# Patient Record
Sex: Male | Born: 1955 | Race: Black or African American | Hispanic: No | Smoking: Current every day smoker
Health system: Southern US, Community
[De-identification: ages and names within clinical notes are randomized; demographics above are authoritative.]

## PROBLEM LIST (undated history)

## (undated) DIAGNOSIS — Z9989 Dependence on other enabling machines and devices: Secondary | ICD-10-CM

## (undated) DIAGNOSIS — D75A Glucose-6-phosphate dehydrogenase (G6PD) deficiency without anemia: Secondary | ICD-10-CM

## (undated) DIAGNOSIS — G4730 Sleep apnea, unspecified: Secondary | ICD-10-CM

## (undated) DIAGNOSIS — G4733 Obstructive sleep apnea (adult) (pediatric): Secondary | ICD-10-CM

## (undated) DIAGNOSIS — R0600 Dyspnea, unspecified: Secondary | ICD-10-CM

## (undated) DIAGNOSIS — I10 Essential (primary) hypertension: Secondary | ICD-10-CM

## (undated) DIAGNOSIS — E119 Type 2 diabetes mellitus without complications: Secondary | ICD-10-CM

## (undated) DIAGNOSIS — M1990 Unspecified osteoarthritis, unspecified site: Secondary | ICD-10-CM

## (undated) DIAGNOSIS — M109 Gout, unspecified: Secondary | ICD-10-CM

## (undated) HISTORY — PX: KNEE SURGERY: SHX244

---

## 1978-10-12 HISTORY — PX: LAPARASCOPIC MEDICAN ARCUATE LIGAMENT RELEASE: SHX6852

## 2014-10-26 ENCOUNTER — Inpatient Hospital Stay (HOSPITAL_COMMUNITY)
Admission: EM | Admit: 2014-10-26 | Discharge: 2014-10-29 | DRG: 291 | Disposition: A | Discharge status: Home / Self Care | Payer: Non-veteran care | Attending: Internal Medicine | Admitting: Internal Medicine

## 2014-10-26 DIAGNOSIS — R778 Other specified abnormalities of plasma proteins: Secondary | ICD-10-CM | POA: Diagnosis not present

## 2014-10-26 DIAGNOSIS — D649 Anemia, unspecified: Secondary | ICD-10-CM | POA: Diagnosis present

## 2014-10-26 DIAGNOSIS — Z7982 Long term (current) use of aspirin: Secondary | ICD-10-CM

## 2014-10-26 DIAGNOSIS — J810 Acute pulmonary edema: Secondary | ICD-10-CM

## 2014-10-26 DIAGNOSIS — I5032 Chronic diastolic (congestive) heart failure: Secondary | ICD-10-CM | POA: Diagnosis present

## 2014-10-26 DIAGNOSIS — T502X5A Adverse effect of carbonic-anhydrase inhibitors, benzothiadiazides and other diuretics, initial encounter: Secondary | ICD-10-CM | POA: Diagnosis present

## 2014-10-26 DIAGNOSIS — G4733 Obstructive sleep apnea (adult) (pediatric): Secondary | ICD-10-CM | POA: Diagnosis present

## 2014-10-26 DIAGNOSIS — F17210 Nicotine dependence, cigarettes, uncomplicated: Secondary | ICD-10-CM | POA: Diagnosis present

## 2014-10-26 DIAGNOSIS — D75A Glucose-6-phosphate dehydrogenase (G6PD) deficiency without anemia: Secondary | ICD-10-CM | POA: Diagnosis present

## 2014-10-26 DIAGNOSIS — I119 Hypertensive heart disease without heart failure: Secondary | ICD-10-CM | POA: Diagnosis present

## 2014-10-26 DIAGNOSIS — Z79899 Other long term (current) drug therapy: Secondary | ICD-10-CM

## 2014-10-26 DIAGNOSIS — F17200 Nicotine dependence, unspecified, uncomplicated: Secondary | ICD-10-CM | POA: Diagnosis present

## 2014-10-26 DIAGNOSIS — G4730 Sleep apnea, unspecified: Secondary | ICD-10-CM

## 2014-10-26 DIAGNOSIS — E11319 Type 2 diabetes mellitus with unspecified diabetic retinopathy without macular edema: Secondary | ICD-10-CM | POA: Diagnosis present

## 2014-10-26 DIAGNOSIS — E1122 Type 2 diabetes mellitus with diabetic chronic kidney disease: Secondary | ICD-10-CM | POA: Diagnosis present

## 2014-10-26 DIAGNOSIS — J9601 Acute respiratory failure with hypoxia: Secondary | ICD-10-CM | POA: Diagnosis present

## 2014-10-26 DIAGNOSIS — E1165 Type 2 diabetes mellitus with hyperglycemia: Secondary | ICD-10-CM | POA: Diagnosis present

## 2014-10-26 DIAGNOSIS — T380X5A Adverse effect of glucocorticoids and synthetic analogues, initial encounter: Secondary | ICD-10-CM | POA: Diagnosis present

## 2014-10-26 DIAGNOSIS — N183 Chronic kidney disease, stage 3 (moderate): Secondary | ICD-10-CM | POA: Diagnosis present

## 2014-10-26 DIAGNOSIS — I130 Hypertensive heart and chronic kidney disease with heart failure and stage 1 through stage 4 chronic kidney disease, or unspecified chronic kidney disease: Secondary | ICD-10-CM | POA: Diagnosis present

## 2014-10-26 DIAGNOSIS — Z8701 Personal history of pneumonia (recurrent): Secondary | ICD-10-CM

## 2014-10-26 DIAGNOSIS — Z6841 Body Mass Index (BMI) 40.0 and over, adult: Secondary | ICD-10-CM

## 2014-10-26 DIAGNOSIS — E113299 Type 2 diabetes mellitus with mild nonproliferative diabetic retinopathy without macular edema, unspecified eye: Secondary | ICD-10-CM

## 2014-10-26 DIAGNOSIS — J811 Chronic pulmonary edema: Secondary | ICD-10-CM | POA: Diagnosis present

## 2014-10-26 DIAGNOSIS — R7989 Other specified abnormal findings of blood chemistry: Secondary | ICD-10-CM | POA: Diagnosis not present

## 2014-10-26 DIAGNOSIS — I5033 Acute on chronic diastolic (congestive) heart failure: Principal | ICD-10-CM | POA: Diagnosis present

## 2014-10-26 DIAGNOSIS — R0902 Hypoxemia: Secondary | ICD-10-CM

## 2014-10-26 HISTORY — DX: Dependence on other enabling machines and devices: Z99.89

## 2014-10-26 HISTORY — DX: Glucose-6-phosphate dehydrogenase (G6PD) deficiency without anemia: D75.A

## 2014-10-26 HISTORY — DX: Obstructive sleep apnea (adult) (pediatric): G47.33

## 2014-10-26 HISTORY — DX: Morbid (severe) obesity due to excess calories: E66.01

## 2014-10-26 HISTORY — DX: Essential (primary) hypertension: I10

## 2014-10-26 HISTORY — DX: Sleep apnea, unspecified: G47.30

## 2014-10-27 ENCOUNTER — Encounter (HOSPITAL_COMMUNITY): Payer: Self-pay | Admitting: *Deleted

## 2014-10-27 ENCOUNTER — Emergency Department (HOSPITAL_COMMUNITY): Payer: Non-veteran care

## 2014-10-27 DIAGNOSIS — E113299 Type 2 diabetes mellitus with mild nonproliferative diabetic retinopathy without macular edema, unspecified eye: Secondary | ICD-10-CM

## 2014-10-27 DIAGNOSIS — J811 Chronic pulmonary edema: Secondary | ICD-10-CM | POA: Diagnosis present

## 2014-10-27 DIAGNOSIS — E119 Type 2 diabetes mellitus without complications: Secondary | ICD-10-CM

## 2014-10-27 DIAGNOSIS — I10 Essential (primary) hypertension: Secondary | ICD-10-CM

## 2014-10-27 DIAGNOSIS — R7989 Other specified abnormal findings of blood chemistry: Secondary | ICD-10-CM | POA: Diagnosis not present

## 2014-10-27 DIAGNOSIS — F17200 Nicotine dependence, unspecified, uncomplicated: Secondary | ICD-10-CM | POA: Diagnosis present

## 2014-10-27 DIAGNOSIS — E669 Obesity, unspecified: Secondary | ICD-10-CM

## 2014-10-27 DIAGNOSIS — I5032 Chronic diastolic (congestive) heart failure: Secondary | ICD-10-CM | POA: Diagnosis not present

## 2014-10-27 DIAGNOSIS — I130 Hypertensive heart and chronic kidney disease with heart failure and stage 1 through stage 4 chronic kidney disease, or unspecified chronic kidney disease: Secondary | ICD-10-CM | POA: Diagnosis present

## 2014-10-27 DIAGNOSIS — F17210 Nicotine dependence, cigarettes, uncomplicated: Secondary | ICD-10-CM | POA: Diagnosis present

## 2014-10-27 DIAGNOSIS — D550 Anemia due to glucose-6-phosphate dehydrogenase [G6PD] deficiency: Secondary | ICD-10-CM

## 2014-10-27 DIAGNOSIS — E1122 Type 2 diabetes mellitus with diabetic chronic kidney disease: Secondary | ICD-10-CM | POA: Diagnosis present

## 2014-10-27 DIAGNOSIS — T502X5A Adverse effect of carbonic-anhydrase inhibitors, benzothiadiazides and other diuretics, initial encounter: Secondary | ICD-10-CM | POA: Diagnosis present

## 2014-10-27 DIAGNOSIS — J9601 Acute respiratory failure with hypoxia: Secondary | ICD-10-CM

## 2014-10-27 DIAGNOSIS — Z79899 Other long term (current) drug therapy: Secondary | ICD-10-CM | POA: Diagnosis not present

## 2014-10-27 DIAGNOSIS — J810 Acute pulmonary edema: Secondary | ICD-10-CM

## 2014-10-27 DIAGNOSIS — G4733 Obstructive sleep apnea (adult) (pediatric): Secondary | ICD-10-CM | POA: Diagnosis present

## 2014-10-27 DIAGNOSIS — I5033 Acute on chronic diastolic (congestive) heart failure: Secondary | ICD-10-CM | POA: Diagnosis not present

## 2014-10-27 DIAGNOSIS — E11319 Type 2 diabetes mellitus with unspecified diabetic retinopathy without macular edema: Secondary | ICD-10-CM | POA: Diagnosis present

## 2014-10-27 DIAGNOSIS — I5031 Acute diastolic (congestive) heart failure: Secondary | ICD-10-CM | POA: Diagnosis not present

## 2014-10-27 DIAGNOSIS — G4730 Sleep apnea, unspecified: Secondary | ICD-10-CM

## 2014-10-27 DIAGNOSIS — I119 Hypertensive heart disease without heart failure: Secondary | ICD-10-CM | POA: Diagnosis present

## 2014-10-27 DIAGNOSIS — T380X5A Adverse effect of glucocorticoids and synthetic analogues, initial encounter: Secondary | ICD-10-CM | POA: Diagnosis present

## 2014-10-27 DIAGNOSIS — R0602 Shortness of breath: Secondary | ICD-10-CM | POA: Diagnosis not present

## 2014-10-27 DIAGNOSIS — R778 Other specified abnormalities of plasma proteins: Secondary | ICD-10-CM | POA: Diagnosis not present

## 2014-10-27 DIAGNOSIS — Z6841 Body Mass Index (BMI) 40.0 and over, adult: Secondary | ICD-10-CM | POA: Diagnosis not present

## 2014-10-27 DIAGNOSIS — Z7982 Long term (current) use of aspirin: Secondary | ICD-10-CM | POA: Diagnosis not present

## 2014-10-27 DIAGNOSIS — Z8701 Personal history of pneumonia (recurrent): Secondary | ICD-10-CM | POA: Diagnosis not present

## 2014-10-27 DIAGNOSIS — I110 Hypertensive heart disease with heart failure: Secondary | ICD-10-CM | POA: Diagnosis not present

## 2014-10-27 DIAGNOSIS — D649 Anemia, unspecified: Secondary | ICD-10-CM | POA: Diagnosis present

## 2014-10-27 DIAGNOSIS — N183 Chronic kidney disease, stage 3 (moderate): Secondary | ICD-10-CM | POA: Diagnosis present

## 2014-10-27 DIAGNOSIS — Z720 Tobacco use: Secondary | ICD-10-CM

## 2014-10-27 DIAGNOSIS — D75A Glucose-6-phosphate dehydrogenase (G6PD) deficiency without anemia: Secondary | ICD-10-CM | POA: Diagnosis present

## 2014-10-27 DIAGNOSIS — E1165 Type 2 diabetes mellitus with hyperglycemia: Secondary | ICD-10-CM | POA: Diagnosis present

## 2014-10-27 LAB — CBC WITH DIFFERENTIAL/PLATELET
Basophils Absolute: 0 10*3/uL (ref 0.0–0.1)
Basophils Relative: 0 % (ref 0–1)
EOS PCT: 0 % (ref 0–5)
Eosinophils Absolute: 0 10*3/uL (ref 0.0–0.7)
HCT: 32.5 % — ABNORMAL LOW (ref 39.0–52.0)
Hemoglobin: 9.8 g/dL — ABNORMAL LOW (ref 13.0–17.0)
Lymphocytes Relative: 4 % — ABNORMAL LOW (ref 12–46)
Lymphs Abs: 0.5 10*3/uL — ABNORMAL LOW (ref 0.7–4.0)
MCH: 26.3 pg (ref 26.0–34.0)
MCHC: 30.2 g/dL (ref 30.0–36.0)
MCV: 87.1 fL (ref 78.0–100.0)
MONOS PCT: 2 % — AB (ref 3–12)
Monocytes Absolute: 0.3 10*3/uL (ref 0.1–1.0)
NEUTROS ABS: 12.7 10*3/uL — AB (ref 1.7–7.7)
NEUTROS PCT: 94 % — AB (ref 43–77)
PLATELETS: 240 10*3/uL (ref 150–400)
RBC: 3.73 MIL/uL — ABNORMAL LOW (ref 4.22–5.81)
RDW: 15.8 % — ABNORMAL HIGH (ref 11.5–15.5)
WBC: 13.5 10*3/uL — ABNORMAL HIGH (ref 4.0–10.5)

## 2014-10-27 LAB — CBC
HCT: 31 % — ABNORMAL LOW (ref 39.0–52.0)
Hemoglobin: 9.5 g/dL — ABNORMAL LOW (ref 13.0–17.0)
MCH: 26.9 pg (ref 26.0–34.0)
MCHC: 30.6 g/dL (ref 30.0–36.0)
MCV: 87.8 fL (ref 78.0–100.0)
Platelets: 255 10*3/uL (ref 150–400)
RBC: 3.53 MIL/uL — ABNORMAL LOW (ref 4.22–5.81)
RDW: 16 % — AB (ref 11.5–15.5)
WBC: 15.1 10*3/uL — ABNORMAL HIGH (ref 4.0–10.5)

## 2014-10-27 LAB — EXPECTORATED SPUTUM ASSESSMENT W REFEX TO RESP CULTURE

## 2014-10-27 LAB — BASIC METABOLIC PANEL
Anion gap: 8 (ref 5–15)
BUN: 15 mg/dL (ref 6–23)
CO2: 24 mmol/L (ref 19–32)
Calcium: 8.7 mg/dL (ref 8.4–10.5)
Chloride: 108 mEq/L (ref 96–112)
Creatinine, Ser: 1.24 mg/dL (ref 0.50–1.35)
GFR calc non Af Amer: 61 mL/min — ABNORMAL LOW (ref >90)
GFR, EST AFRICAN AMERICAN: 71 mL/min — AB (ref >90)
GLUCOSE: 208 mg/dL — AB (ref 70–99)
POTASSIUM: 3.6 mmol/L (ref 3.5–5.1)
SODIUM: 140 mmol/L (ref 135–145)

## 2014-10-27 LAB — PROTIME-INR
INR: 1.05 (ref 0.00–1.49)
Prothrombin Time: 13.8 seconds (ref 11.6–15.2)

## 2014-10-27 LAB — COMPREHENSIVE METABOLIC PANEL
ALT: 21 U/L (ref 0–53)
AST: 29 U/L (ref 0–37)
Albumin: 3.9 g/dL (ref 3.5–5.2)
Alkaline Phosphatase: 95 U/L (ref 39–117)
Anion gap: 9 (ref 5–15)
BILIRUBIN TOTAL: 0.9 mg/dL (ref 0.3–1.2)
BUN: 15 mg/dL (ref 6–23)
CO2: 25 mmol/L (ref 19–32)
Calcium: 8.9 mg/dL (ref 8.4–10.5)
Chloride: 104 mEq/L (ref 96–112)
Creatinine, Ser: 1.23 mg/dL (ref 0.50–1.35)
GFR calc Af Amer: 72 mL/min — ABNORMAL LOW (ref >90)
GFR calc non Af Amer: 62 mL/min — ABNORMAL LOW (ref >90)
Glucose, Bld: 243 mg/dL — ABNORMAL HIGH (ref 70–99)
Potassium: 3.2 mmol/L — ABNORMAL LOW (ref 3.5–5.1)
SODIUM: 138 mmol/L (ref 135–145)
TOTAL PROTEIN: 8.3 g/dL (ref 6.0–8.3)

## 2014-10-27 LAB — EXPECTORATED SPUTUM ASSESSMENT W GRAM STAIN, RFLX TO RESP C

## 2014-10-27 LAB — GLUCOSE, CAPILLARY
GLUCOSE-CAPILLARY: 206 mg/dL — AB (ref 70–99)
GLUCOSE-CAPILLARY: 311 mg/dL — AB (ref 70–99)
GLUCOSE-CAPILLARY: 405 mg/dL — AB (ref 70–99)
Glucose-Capillary: 293 mg/dL — ABNORMAL HIGH (ref 70–99)

## 2014-10-27 LAB — APTT: aPTT: 38 seconds — ABNORMAL HIGH (ref 24–37)

## 2014-10-27 LAB — INFLUENZA PANEL BY PCR (TYPE A & B)
H1N1FLUPCR: NOT DETECTED
INFLAPCR: NEGATIVE
INFLBPCR: NEGATIVE

## 2014-10-27 LAB — TROPONIN I
Troponin I: 0.13 ng/mL — ABNORMAL HIGH (ref <0.031)
Troponin I: 0.11 ng/mL — ABNORMAL HIGH (ref <0.031)
Troponin I: 0.08 ng/mL — ABNORMAL HIGH (ref <0.031)

## 2014-10-27 LAB — HEMOGLOBIN A1C
Hgb A1c MFr Bld: 6.6 % — ABNORMAL HIGH (ref <5.7)
Mean Plasma Glucose: 143 mg/dL — ABNORMAL HIGH (ref <117)

## 2014-10-27 LAB — TSH: TSH: 0.557 u[IU]/mL (ref 0.350–4.500)

## 2014-10-27 LAB — I-STAT TROPONIN, ED: Troponin i, poc: 0.01 ng/mL (ref 0.00–0.08)

## 2014-10-27 LAB — HEPARIN LEVEL (UNFRACTIONATED): Heparin Unfractionated: 0.14 IU/mL — ABNORMAL LOW (ref 0.30–0.70)

## 2014-10-27 LAB — STREP PNEUMONIAE URINARY ANTIGEN: STREP PNEUMO URINARY ANTIGEN: NEGATIVE

## 2014-10-27 LAB — BRAIN NATRIURETIC PEPTIDE: B Natriuretic Peptide: 336.1 pg/mL — ABNORMAL HIGH (ref 0.0–100.0)

## 2014-10-27 LAB — MAGNESIUM: Magnesium: 1.9 mg/dL (ref 1.5–2.5)

## 2014-10-27 MED ORDER — ENOXAPARIN SODIUM 80 MG/0.8ML ~~LOC~~ SOLN
80.0000 mg | Freq: Every day | SUBCUTANEOUS | Status: DC
  Filled 2014-10-27: qty 0.8

## 2014-10-27 MED ORDER — METHYLPREDNISOLONE SODIUM SUCC 125 MG IJ SOLR
60.0000 mg | Freq: Two times a day (BID) | INTRAMUSCULAR | Status: DC
  Administered 2014-10-27 – 2014-10-28 (×3): 60 mg via INTRAVENOUS
  Filled 2014-10-27 (×5): qty 0.96

## 2014-10-27 MED ORDER — ACETAMINOPHEN 325 MG PO TABS: 650.0000 mg | ORAL_TABLET | Freq: Four times a day (QID) | ORAL | Status: DC | PRN

## 2014-10-27 MED ORDER — HEPARIN (PORCINE) IN NACL 100-0.45 UNIT/ML-% IJ SOLN
1800.0000 [IU]/h | INTRAMUSCULAR | Status: DC
  Administered 2014-10-27: 1400 [IU]/h via INTRAVENOUS
  Administered 2014-10-28: 1800 [IU]/h via INTRAVENOUS
  Filled 2014-10-27 (×2): qty 250

## 2014-10-27 MED ORDER — SERTRALINE HCL 100 MG PO TABS
100.0000 mg | ORAL_TABLET | Freq: Every day | ORAL | Status: DC
  Administered 2014-10-27 – 2014-10-29 (×3): 100 mg via ORAL
  Filled 2014-10-27 (×3): qty 1

## 2014-10-27 MED ORDER — POTASSIUM CHLORIDE ER 10 MEQ PO TBCR
20.0000 meq | EXTENDED_RELEASE_TABLET | Freq: Every day | ORAL | Status: DC
  Administered 2014-10-27 – 2014-10-29 (×3): 20 meq via ORAL
  Filled 2014-10-27 (×3): qty 2

## 2014-10-27 MED ORDER — NICOTINE 14 MG/24HR TD PT24
14.0000 mg | MEDICATED_PATCH | Freq: Every day | TRANSDERMAL | Status: DC
  Administered 2014-10-27 – 2014-10-29 (×3): 14 mg via TRANSDERMAL
  Filled 2014-10-27 (×3): qty 1

## 2014-10-27 MED ORDER — IPRATROPIUM-ALBUTEROL 0.5-2.5 (3) MG/3ML IN SOLN
3.0000 mL | Freq: Four times a day (QID) | RESPIRATORY_TRACT | Status: DC
  Administered 2014-10-27 – 2014-10-29 (×6): 3 mL via RESPIRATORY_TRACT
  Filled 2014-10-27 (×7): qty 3

## 2014-10-27 MED ORDER — LISINOPRIL 40 MG PO TABS
40.0000 mg | ORAL_TABLET | Freq: Every day | ORAL | Status: DC
  Administered 2014-10-27: 40 mg via ORAL
  Filled 2014-10-27 (×2): qty 1

## 2014-10-27 MED ORDER — GUAIFENESIN ER 600 MG PO TB12
600.0000 mg | ORAL_TABLET | Freq: Two times a day (BID) | ORAL | Status: DC
  Administered 2014-10-27 – 2014-10-29 (×5): 600 mg via ORAL
  Filled 2014-10-27 (×7): qty 1

## 2014-10-27 MED ORDER — POTASSIUM CHLORIDE CRYS ER 20 MEQ PO TBCR
20.0000 meq | EXTENDED_RELEASE_TABLET | Freq: Once | ORAL | Status: AC
  Administered 2014-10-27: 20 meq via ORAL
  Filled 2014-10-27: qty 1

## 2014-10-27 MED ORDER — DEXTROSE 5 % IV SOLN
500.0000 mg | INTRAVENOUS | Status: DC
  Administered 2014-10-27 – 2014-10-28 (×2): 500 mg via INTRAVENOUS
  Filled 2014-10-27 (×2): qty 500

## 2014-10-27 MED ORDER — INSULIN ASPART 100 UNIT/ML ~~LOC~~ SOLN
0.0000 [IU] | Freq: Three times a day (TID) | SUBCUTANEOUS | Status: DC
  Administered 2014-10-27: 5 [IU] via SUBCUTANEOUS
  Administered 2014-10-27: 11 [IU] via SUBCUTANEOUS
  Administered 2014-10-27: 8 [IU] via SUBCUTANEOUS
  Administered 2014-10-28: 11 [IU] via SUBCUTANEOUS
  Administered 2014-10-28: 8 [IU] via SUBCUTANEOUS
  Administered 2014-10-28: 5 [IU] via SUBCUTANEOUS
  Administered 2014-10-29: 2 [IU] via SUBCUTANEOUS

## 2014-10-27 MED ORDER — ALBUTEROL SULFATE (2.5 MG/3ML) 0.083% IN NEBU: 3.0000 mL | INHALATION_SOLUTION | Freq: Four times a day (QID) | RESPIRATORY_TRACT | Status: DC | PRN

## 2014-10-27 MED ORDER — HYDRALAZINE HCL 50 MG PO TABS
100.0000 mg | ORAL_TABLET | Freq: Three times a day (TID) | ORAL | Status: DC
  Administered 2014-10-27 – 2014-10-29 (×8): 100 mg via ORAL
  Filled 2014-10-27 (×10): qty 2

## 2014-10-27 MED ORDER — FUROSEMIDE 10 MG/ML IJ SOLN
40.0000 mg | Freq: Every day | INTRAMUSCULAR | Status: DC
  Administered 2014-10-27: 40 mg via INTRAVENOUS
  Filled 2014-10-27: qty 4

## 2014-10-27 MED ORDER — IPRATROPIUM-ALBUTEROL 0.5-2.5 (3) MG/3ML IN SOLN
3.0000 mL | RESPIRATORY_TRACT | Status: DC
  Administered 2014-10-27: 3 mL via RESPIRATORY_TRACT
  Filled 2014-10-27: qty 3

## 2014-10-27 MED ORDER — ACETAMINOPHEN 650 MG RE SUPP: 650.0000 mg | Freq: Four times a day (QID) | RECTAL | Status: DC | PRN

## 2014-10-27 MED ORDER — AMLODIPINE BESYLATE 10 MG PO TABS
10.0000 mg | ORAL_TABLET | Freq: Every day | ORAL | Status: DC
  Administered 2014-10-27 – 2014-10-29 (×3): 10 mg via ORAL
  Filled 2014-10-27 (×3): qty 1

## 2014-10-27 MED ORDER — INSULIN GLARGINE 100 UNIT/ML ~~LOC~~ SOLN
40.0000 [IU] | Freq: Two times a day (BID) | SUBCUTANEOUS | Status: DC
  Administered 2014-10-27 – 2014-10-29 (×5): 40 [IU] via SUBCUTANEOUS
  Filled 2014-10-27 (×5): qty 0.4

## 2014-10-27 MED ORDER — HEPARIN BOLUS VIA INFUSION
3000.0000 [IU] | Freq: Once | INTRAVENOUS | Status: AC
  Administered 2014-10-27: 3000 [IU] via INTRAVENOUS
  Filled 2014-10-27: qty 3000

## 2014-10-27 MED ORDER — CEFTRIAXONE SODIUM IN DEXTROSE 20 MG/ML IV SOLN
1.0000 g | INTRAVENOUS | Status: DC
  Administered 2014-10-27 – 2014-10-28 (×2): 1 g via INTRAVENOUS
  Filled 2014-10-27 (×2): qty 50

## 2014-10-27 MED ORDER — METOPROLOL TARTRATE 50 MG PO TABS
100.0000 mg | ORAL_TABLET | Freq: Two times a day (BID) | ORAL | Status: DC
  Administered 2014-10-27 – 2014-10-29 (×5): 100 mg via ORAL
  Filled 2014-10-27 (×5): qty 2

## 2014-10-27 MED ORDER — CETYLPYRIDINIUM CHLORIDE 0.05 % MT LIQD
7.0000 mL | Freq: Two times a day (BID) | OROMUCOSAL | Status: DC
  Administered 2014-10-27 (×2): 7 mL via OROMUCOSAL

## 2014-10-27 MED ORDER — SODIUM CHLORIDE 0.9 % IJ SOLN
3.0000 mL | Freq: Two times a day (BID) | INTRAMUSCULAR | Status: DC
  Administered 2014-10-27 – 2014-10-28 (×3): 3 mL via INTRAVENOUS

## 2014-10-27 MED ORDER — INSULIN ASPART 100 UNIT/ML ~~LOC~~ SOLN
0.0000 [IU] | Freq: Every day | SUBCUTANEOUS | Status: DC
  Administered 2014-10-28: 3 [IU] via SUBCUTANEOUS

## 2014-10-27 MED ORDER — CHLORHEXIDINE GLUCONATE 0.12 % MT SOLN
15.0000 mL | Freq: Two times a day (BID) | OROMUCOSAL | Status: DC
  Administered 2014-10-27 (×2): 15 mL via OROMUCOSAL
  Filled 2014-10-27 (×5): qty 15

## 2014-10-27 MED ORDER — ASPIRIN EC 81 MG PO TBEC
81.0000 mg | DELAYED_RELEASE_TABLET | Freq: Every day | ORAL | Status: DC
  Administered 2014-10-27 – 2014-10-29 (×3): 81 mg via ORAL
  Filled 2014-10-27 (×3): qty 1

## 2014-10-27 MED ORDER — FUROSEMIDE 10 MG/ML IJ SOLN
40.0000 mg | Freq: Once | INTRAMUSCULAR | Status: AC
  Administered 2014-10-27: 40 mg via INTRAVENOUS
  Filled 2014-10-27: qty 4

## 2014-10-27 MED ORDER — ONDANSETRON HCL 4 MG/2ML IJ SOLN: 4.0000 mg | Freq: Four times a day (QID) | INTRAMUSCULAR | Status: DC | PRN

## 2014-10-27 MED ORDER — ROSUVASTATIN CALCIUM 10 MG PO TABS
10.0000 mg | ORAL_TABLET | Freq: Every day | ORAL | Status: DC
  Administered 2014-10-27 – 2014-10-28 (×2): 10 mg via ORAL
  Filled 2014-10-27 (×3): qty 1

## 2014-10-27 MED ORDER — ONDANSETRON HCL 4 MG PO TABS: 4.0000 mg | ORAL_TABLET | Freq: Four times a day (QID) | ORAL | Status: DC | PRN

## 2014-10-27 MED ORDER — ENOXAPARIN SODIUM 40 MG/0.4ML ~~LOC~~ SOLN: 40.0000 mg | SUBCUTANEOUS | Status: DC

## 2014-10-27 MED ORDER — INSULIN ASPART 100 UNIT/ML ~~LOC~~ SOLN
6.0000 [IU] | Freq: Once | SUBCUTANEOUS | Status: AC
  Administered 2014-10-27: 6 [IU] via SUBCUTANEOUS

## 2014-10-27 NOTE — ED Notes (Signed)
Pt reports SOB with weakness, chills and sweats tonight.  Pt recently was treated for PNA.  Pt reports difficulty breathing.

## 2014-10-27 NOTE — Progress Notes (Signed)
ANTIBIOTIC CONSULT NOTE - INITIAL  Pharmacy Consult for Rocephin Indication: CAP  Allergies  Allergen Reactions  . Sulfa Antibiotics   . Penicillins Rash    Patient Measurements: Weight: (!) 352 lb 11.8 oz (160 kg)   Vital Signs: Temp: 98.7 F (37.1 C) (01/16 0356) Temp Source: Oral (01/16 0356) BP: 163/66 mmHg (01/16 0356) Pulse Rate: 79 (01/16 0356) Intake/Output from previous day: 01/15 0701 - 01/16 0700 In: -  Out: 600 [Urine:600] Intake/Output from this shift: Total I/O In: -  Out: 600 [Urine:600]  Labs:  Recent Labs  10/27/14 0056  WBC 15.1*  HGB 9.5*  PLT 255  CREATININE 1.24   CrCl cannot be calculated (Unknown ideal weight.). No results for input(s): VANCOTROUGH, VANCOPEAK, VANCORANDOM, GENTTROUGH, GENTPEAK, GENTRANDOM, TOBRATROUGH, TOBRAPEAK, TOBRARND, AMIKACINPEAK, AMIKACINTROU, AMIKACIN in the last 72 hours.   Microbiology: No results found for this or any previous visit (from the past 720 hour(s)).  Medical History: Past Medical History  Diagnosis Date  . Hypertension   . Diabetes mellitus without complication   . Sleep apnea   . G6PD deficiency 10/27/2014  . OSA on CPAP     16 CM pressure    Medications:  Scheduled:  . amLODipine  10 mg Oral Daily  . antiseptic oral rinse  7 mL Mouth Rinse q12n4p  . aspirin EC  81 mg Oral Daily  . azithromycin  500 mg Intravenous Q24H  . cefTRIAXone (ROCEPHIN)  IV  1 g Intravenous Q24H  . chlorhexidine  15 mL Mouth Rinse BID  . enoxaparin (LOVENOX) injection  80 mg Subcutaneous Daily  . furosemide  40 mg Intravenous Daily  . guaiFENesin  600 mg Oral BID  . hydrALAZINE  100 mg Oral TID  . insulin aspart  0-15 Units Subcutaneous TID WC  . insulin aspart  0-5 Units Subcutaneous QHS  . insulin glargine  40 Units Subcutaneous BID  . ipratropium-albuterol  3 mL Nebulization Q6H WA  . lisinopril  40 mg Oral Daily  . methylPREDNISolone (SOLU-MEDROL) injection  60 mg Intravenous Q12H  . metoprolol  100 mg  Oral BID  . nicotine  14 mg Transdermal Daily  . potassium chloride  20 mEq Oral Daily  . rosuvastatin  10 mg Oral Daily  . sertraline  100 mg Oral Daily  . sodium chloride  3 mL Intravenous Q12H   Infusions:   Assessment: 33 yoM c/o SOB with weakness, chills and sweats.  Rocephin per Rx. Zmax per MD for CAP.   Goal of Therapy:  Treat infection  Plan:   Rocephin 1 Gm IV q24h  F/u cultures as needed  Dorrene German 10/27/2014,5:44 AM

## 2014-10-27 NOTE — ED Notes (Signed)
MD at bedside. 

## 2014-10-27 NOTE — ED Notes (Signed)
Pt. Given water

## 2014-10-27 NOTE — Progress Notes (Signed)
Pt's son is bringing pt's home CPAP machine, mask and tubing. [employer] has been contacted and will inspect CPAP when available. Pt is currently on a 3L nasal cannula SpO2 92%. RN aware.

## 2014-10-27 NOTE — Progress Notes (Signed)
[facility] PROGRESS NOTE  Anatoly Dory [id number] DOB: October 29, 1955 DOA: 10/26/2014  PCP: Patient sees his PCP at [facility] HPI: 59 year old African-American male with a history of hypertension, diabetes, sleep apnea, presented with the shortness of breath. He had recently finished a course of antibiotics for pneumonia in December. He felt better. And now presented with dyspnea and orthopnea. He was admitted for further management  Past medical history:  Past Medical History  Diagnosis Date  . Hypertension   . Diabetes mellitus without complication   . Sleep apnea   . G6PD deficiency 10/27/2014  . OSA on CPAP     16 CM pressure    Consultants: Cardiology  Procedures:  Echocardiogram is pending  Antibiotics: Ceftriaxone  Subjective: Patient feels some better this morning. Denies any chest pain, nausea, vomiting. Breathing is improved though still short of breath.  Objective: Vital Signs  Filed Vitals:   10/27/14 0315 10/27/14 0356 10/27/14 0452 10/27/14 0850  BP: 159/67 163/66    Pulse: 76 79    Temp:  98.7 F (37.1 C)    TempSrc:  Oral    Resp: 18 20    Height:  5\' 9"  (1.753 m)    Weight:  160 kg (352 lb 11.8 oz)    SpO2: 94% 98% 92% 94%    Intake/Output Summary (Last 24 hours) at 10/27/14 1042 Last data filed at 10/27/14 0825  Gross per 24 hour  Intake      0 ml  Output   1300 ml  Net  -1300 ml   Filed Weights   10/27/14 0356  Weight: 160 kg (352 lb 11.8 oz)    General appearance: alert, cooperative, appears stated age, no distress and morbidly obese Resp: Crackles bilateral bases. No wheezing. No rhonchi. Cardio: regular rate and rhythm, S1, S2 normal, no murmur, click, rub or gallop GI: soft, non-tender; bowel sounds normal; no masses,  no organomegaly Extremities: Minimal edema noted bilateral lower extremities. Neurologic: No focal deficits  Lab Results:  Basic Metabolic Panel:  Recent Labs Lab 10/27/14 0056 10/27/14 0840    NA 140 138  K 3.6 3.2*  CL 108 104  CO2 24 25  GLUCOSE 208* 243*  BUN 15 15  CREATININE 1.24 1.23  CALCIUM 8.7 8.9   Liver Function Tests:  Recent Labs Lab 10/27/14 0840  AST 29  ALT 21  ALKPHOS 95  BILITOT 0.9  PROT 8.3  ALBUMIN 3.9   CBC:  Recent Labs Lab 10/27/14 0056 10/27/14 0840  WBC 15.1* 13.5*  NEUTROABS  --  12.7*  HGB 9.5* 9.8*  HCT 31.0* 32.5*  MCV 87.8 87.1  PLT 255 240   Cardiac Enzymes:  Recent Labs Lab 10/27/14 0840  TROPONINI 0.13*   CBG:  Recent Labs Lab 10/27/14 0800  GLUCAP 206*     Studies/Results: Dg Chest 2 View (if Patient Has Fever And/or Copd)  10/27/2014   CLINICAL DATA:  Acute onset of shortness of breath and weakness. Cough. Initial encounter.  EXAM: CHEST  2 VIEW  COMPARISON:  None.  FINDINGS: The lungs are well-aerated. Vascular congestion is noted, with increased interstitial markings, concerning for mild pulmonary edema. There is no evidence of pleural effusion or pneumothorax.  The heart is mildly enlarged. No acute osseous abnormalities are seen.  IMPRESSION: Vascular congestion and mild cardiomegaly, with increased interstitial markings, concerning for mild pulmonary edema.   Electronically Signed   By: Garald Balding M.D.   On: 10/27/2014 01:12  Medications:  Scheduled: . amLODipine  10 mg Oral Daily  . antiseptic oral rinse  7 mL Mouth Rinse q12n4p  . aspirin EC  81 mg Oral Daily  . azithromycin  500 mg Intravenous Q24H  . cefTRIAXone (ROCEPHIN)  IV  1 g Intravenous Q24H  . chlorhexidine  15 mL Mouth Rinse BID  . furosemide  40 mg Intravenous Daily  . guaiFENesin  600 mg Oral BID  . hydrALAZINE  100 mg Oral TID  . insulin aspart  0-15 Units Subcutaneous TID WC  . insulin aspart  0-5 Units Subcutaneous QHS  . insulin glargine  40 Units Subcutaneous BID  . ipratropium-albuterol  3 mL Nebulization Q6H WA  . lisinopril  40 mg Oral Daily  . methylPREDNISolone (SOLU-MEDROL) injection  60 mg Intravenous Q12H  .  metoprolol  100 mg Oral BID  . nicotine  14 mg Transdermal Daily  . potassium chloride  20 mEq Oral Daily  . rosuvastatin  10 mg Oral Daily  . sertraline  100 mg Oral Daily  . sodium chloride  3 mL Intravenous Q12H   Continuous: . heparin 1,400 Units/hr (10/27/14 1128)   [id number] **OR** acetaminophen, albuterol, ondansetron **OR** ondansetron (ZOFRAN) IV  Assessment/Plan:  Principal Problem:   Acute respiratory failure with hypoxemia Active Problems:   Pulmonary edema   G6PD deficiency   Diabetes mellitus type 2 in obese   Essential hypertension   Smoker    Acute respiratory failure with hypoxemia X-ray was suggestive of pulmonary edema. He recently finished a course of antibiotics for pneumonia. No clearcut infiltrate noted on chest x-ray. Considering that he is improved with Lasix this does appear to be pulmonary edema. Continue current management. Continue antibiotics for now considering elevated WBC. Influenza PCR is pending. He is on steroids which will be continued for now. Consider quick taper  Acute Pulmonary edema with elevated troponin Workup is in progress. Echocardiogram is pending. Continue with lasix daily. Replace potassium. Check magnesium level. EKG did suggest subtle ST changes in V5, V6. Continue aspirin. Possible nSTEMI. Since troponin is mildly elevated we will initiate heparin. Cardiology will be consulted as patient has multiple risk factors for coronary artery disease and will likely end up requiring cardiac workup. Continue statin  Diabetes mellitus type 2. Continue sliding scale coverage and Lantus. HbA1c is pending  History of essential Hypertension. Continue home medications including lisinopril, metoprolol, hydralazine and amlodipine.  Obstructive sleep apnea Continue CPAP  Active smoker Nicotine patch. Patient was counseled against smoking.   DVT Prophylaxis: IV heparin    Code Status: Full code  Family Communication: Discussed with  the patient  Disposition Plan: Not ready for discharge.    LOS: 1 day   [facility] Pager [phone] 10/27/2014, 10:42 AM  If 7PM-7AM, please contact night-coverage at [email or web address], password [facility]

## 2014-10-27 NOTE — Consult Note (Addendum)
Cardiology Consult Note  Admit date: 10/26/2014 Name: Derek Ibarra 59 y.o.  male DOB:  Oct 18, 1955 MRN:  [id number]  Today's date:  10/27/2014  Referring Physician:    [facility]  Primary Physician:    [facility]  Reason for Consultation:    Congestive heart failure, elevation of troponin  IMPRESSIONS: 1.  Evidence of clinical congestive heart failure.  At this point in time it is difficult to tie whether systolic or diastolic. 2.  Mild elevation of troponin without values enough to trend.  Unclear whether this is demand ischemia due to hypertension and heart failure or whether it could be acute coronary syndrome. 3.  Diabetes mellitus with retinopathy. 4.  Morbid obesity. 5.  Recent poor social situation. 6.  Sleep apnea  RECOMMENDATION: 1.  Continue to cycle troponins and keep on heparin while they're cycling. 2.  Await results of echocardiogram. 3.  Continue to diurese. 4.  Further recommendations to follow results of the above  HISTORY: This 59 year old male has a history of hypertension, diabetes, morbid obesity and sleep apnea.  He moved to [place] about a year ago from [place].  After he was unemployed to be near family.  He currently lives alone.  He gets care at  the [facility] and has not been seen here locally.  He was treated for pneumonia in December with cough and had antibiotics and was dyspneic.  Prior to that he did not have dyspnea or chest discomfort.  He presented to the emergency room early this morning with generalized weakness, chills and diaphoresis without chest pain.  He has had mild lower extremity swelling as well as a 10 pound weight gain over the past few weeks.  He normally denies PND, orthopnea, syncope, or claudication.  He has been quite obese for many years.  States that he has diabetic retinopathy but no neuropathy.  Past Medical History  Diagnosis Date  . Hypertension   . Sleep apnea   . G6PD deficiency   . OSA on CPAP      16 CM pressure  . Hypertensive heart disease   . Morbid obesity       Past Surgical History  Procedure Laterality Date  . Knee surgery       Allergies:  is allergic to sulfa antibiotics and penicillins.   Medications: Prior to Admission medications   Medication Sig Start Date End Date Taking? Authorizing Provider  albuterol (PROVENTIL HFA;VENTOLIN HFA) 108 (90 BASE) MCG/ACT inhaler Inhale into the lungs every 6 (six) hours as needed for wheezing or shortness of breath.   Yes Historical Provider, MD  amLODipine (NORVASC) 10 MG tablet Take 10 mg by mouth daily.   Yes Historical Provider, MD  aspirin EC 81 MG tablet Take 81 mg by mouth daily.   Yes Historical Provider, MD  furosemide (LASIX) 20 MG tablet Take 20 mg by mouth daily.   Yes Historical Provider, MD  glipiZIDE (GLUCOTROL) 10 MG tablet Take 20 mg by mouth 2 (two) times daily before a meal.   Yes Historical Provider, MD  hydrALAZINE (APRESOLINE) 100 MG tablet Take 100 mg by mouth 3 (three) times daily.   Yes Historical Provider, MD  insulin glargine (LANTUS) 100 UNIT/ML injection Inject 40 Units into the skin 2 (two) times daily.   Yes Historical Provider, MD  lisinopril (PRINIVIL,ZESTRIL) 40 MG tablet Take 40 mg by mouth daily.   Yes Historical Provider, MD  metoprolol (LOPRESSOR) 50 MG tablet Take 100 mg by mouth 2 (  two) times daily.   Yes Historical Provider, MD  Multiple Vitamin (MULTIVITAMIN WITH MINERALS) TABS tablet Take 1 tablet by mouth daily.   Yes Historical Provider, MD  potassium chloride SA (K-DUR,KLOR-CON) 20 MEQ tablet Take 20 mEq by mouth daily.   Yes Historical Provider, MD  rosuvastatin (CRESTOR) 10 MG tablet Take 10 mg by mouth daily.   Yes Historical Provider, MD  sertraline (ZOLOFT) 100 MG tablet Take 100 mg by mouth daily.   Yes Historical Provider, MD    Family History: Family Status  Relation Status Death Age  . Father Deceased 64'2    comps of CVA  . Mother Deceased 1's     ESRD, DM  . Sister  Deceased     pancreatic cancer  . Brother Deceased     comps unknown  . Sister Alive     DM, ESRD on dialysis  . Brother Alive   . Brother Deceased     possible drug OD    Social History:   reports that he has been smoking Cigarettes.  He has a 30 pack-year smoking history. He has never used smokeless tobacco. He reports that he does not drink alcohol or use illicit drugs.   History   Social History Narrative   Currently unemployed.  Move to [place] in 2015.  Formally worked in the Facilities manager.  Has disability from [place] for multiple reasons.    Review of Systems: Has been obese for many years.  10 pound weight gain recently.  No difficulty swallowing or GI problems.  No history of GI bleeding.  He has moderate nocturia as well as urgency.  Some frequency.  Other than as noted above the remainder of the review of systems is unremarkable  Physical Exam: BP 163/66 mmHg  Pulse 79  Temp(Src) 98.7 F (37.1 C) (Oral)  Resp 20  Ht 5\' 9"  (1.753 m)  Wt 160 kg (352 lb 11.8 oz)  BMI 52.07 kg/m2  SpO2 94%  General appearance: Obese, friendly black male in no acute distress Head: Normocephalic, without obvious abnormality, atraumatic, balding male hair pattern Eyes: conjunctivae/corneas clear. PERRL, EOM's intact. Fundi not examined.  Neck: no adenopathy, no carotid bruit, supple, symmetrical, trachea midline and JVD difficult to assess due to obesity Lungs: Reduced breath sounds bases Heart: regular rate and rhythm, S1, S2 normal, no murmur, click, rub or gallop Abdomen: soft, non-tender; bowel sounds normal; no masses,  no organomegaly Rectal: deferred Extremities: 1+ edema noted Pulses: 2+ and symmetric Skin: Skin color, texture, turgor normal. No rashes or lesions Neurologic: Grossly normal  Labs: CBC  Recent Labs  10/27/14 0840  WBC 13.5*  RBC 3.73*  HGB 9.8*  HCT 32.5*  PLT 240  MCV 87.1  MCH 26.3  MCHC 30.2  RDW 15.8*  LYMPHSABS 0.5*  MONOABS 0.3   EOSABS 0.0  BASOSABS 0.0   CMP   Recent Labs  10/27/14 0840  NA 138  K 3.2*  CL 104  CO2 25  GLUCOSE 243*  BUN 15  CREATININE 1.23  CALCIUM 8.9  PROT 8.3  ALBUMIN 3.9  AST 29  ALT 21  ALKPHOS 95  BILITOT 0.9  GFRNONAA 62*  GFRAA 72*   Cardiac Panel (last 3 results)  Recent Labs  10/27/14 0840  TROPONINI 0.13*     Radiology: Cardiomegaly, mild interstitial edema  EKG: Sinus tachycardia, nonspecific ST and T-wave changes  Signed:  W. Doristine Church MD [facility]   Cardiology Consultant  10/27/2014, 3:03 PM

## 2014-10-27 NOTE — Progress Notes (Signed)
ANTICOAGULATION CONSULT NOTE - Initial Consult  Pharmacy Consult for heparin Indication: NSTEMI  Allergies  Allergen Reactions  . Sulfa Antibiotics   . Penicillins Rash    Patient Measurements: Height: 5\' 9"  (175.3 cm) Weight: (!) 352 lb 11.8 oz (160 kg) IBW/kg (Calculated) : 70.7 Heparin Dosing Weight: *  Vital Signs: Temp: 98.7 F (37.1 C) (01/16 0356) Temp Source: Oral (01/16 0356) BP: 163/66 mmHg (01/16 0356) Pulse Rate: 79 (01/16 0356)  Labs:  Recent Labs  10/27/14 0056 10/27/14 0840  HGB 9.5* 9.8*  HCT 31.0* 32.5*  PLT 255 240  LABPROT  --  13.8  INR  --  1.05  CREATININE 1.24 1.23  TROPONINI  --  0.13*    Estimated Creatinine Clearance: 94.9 mL/min (by C-G formula based on Cr of 1.23).   Medical History: Past Medical History  Diagnosis Date  . Hypertension   . Diabetes mellitus without complication   . Sleep apnea   . G6PD deficiency 10/27/2014  . OSA on CPAP     16 CM pressure    Medications:  Prescriptions prior to admission  Medication Sig Dispense Refill Last Dose  . albuterol (PROVENTIL HFA;VENTOLIN HFA) 108 (90 BASE) MCG/ACT inhaler Inhale into the lungs every 6 (six) hours as needed for wheezing or shortness of breath.   10/26/2014 at Unknown time  . amLODipine (NORVASC) 10 MG tablet Take 10 mg by mouth daily.   10/26/2014 at Unknown time  . aspirin EC 81 MG tablet Take 81 mg by mouth daily.   10/26/2014 at Unknown time  . furosemide (LASIX) 20 MG tablet Take 20 mg by mouth daily.   10/26/2014 at Unknown time  . glipiZIDE (GLUCOTROL) 10 MG tablet Take 20 mg by mouth 2 (two) times daily before a meal.   10/26/2014 at Unknown time  . hydrALAZINE (APRESOLINE) 100 MG tablet Take 100 mg by mouth 3 (three) times daily.   10/26/2014 at Unknown time  . insulin glargine (LANTUS) 100 UNIT/ML injection Inject 40 Units into the skin 2 (two) times daily.   10/26/2014 at Unknown time  . lisinopril (PRINIVIL,ZESTRIL) 40 MG tablet Take 40 mg by mouth daily.    10/26/2014 at Unknown time  . metoprolol (LOPRESSOR) 50 MG tablet Take 100 mg by mouth 2 (two) times daily.   10/26/2014 at 2230  . Multiple Vitamin (MULTIVITAMIN WITH MINERALS) TABS tablet Take 1 tablet by mouth daily.   10/26/2014 at Unknown time  . potassium chloride SA (K-DUR,KLOR-CON) 20 MEQ tablet Take 20 mEq by mouth daily.   10/26/2014 at Unknown time  . rosuvastatin (CRESTOR) 10 MG tablet Take 10 mg by mouth daily.   10/26/2014 at Unknown time  . sertraline (ZOLOFT) 100 MG tablet Take 100 mg by mouth daily.   10/26/2014 at Unknown time   Scheduled:  . amLODipine  10 mg Oral Daily  . antiseptic oral rinse  7 mL Mouth Rinse q12n4p  . aspirin EC  81 mg Oral Daily  . azithromycin  500 mg Intravenous Q24H  . cefTRIAXone (ROCEPHIN)  IV  1 g Intravenous Q24H  . chlorhexidine  15 mL Mouth Rinse BID  . furosemide  40 mg Intravenous Daily  . guaiFENesin  600 mg Oral BID  . hydrALAZINE  100 mg Oral TID  . insulin aspart  0-15 Units Subcutaneous TID WC  . insulin aspart  0-5 Units Subcutaneous QHS  . insulin glargine  40 Units Subcutaneous BID  . ipratropium-albuterol  3 mL Nebulization Q6H WA  .  lisinopril  40 mg Oral Daily  . methylPREDNISolone (SOLU-MEDROL) injection  60 mg Intravenous Q12H  . metoprolol  100 mg Oral BID  . nicotine  14 mg Transdermal Daily  . potassium chloride  20 mEq Oral Daily  . potassium chloride  20 mEq Oral Once  . rosuvastatin  10 mg Oral Daily  . sertraline  100 mg Oral Daily  . sodium chloride  3 mL Intravenous Q12H   Infusions:    Assessment: 61 yoM c/o SOB with weakness, chills and sweats.Pharmacy is asked to start heparin infusion for NSTEMI. Lovenox 80mg  was given at 1030 today as weight-adjusted prophylactic dose. PT/INR wnl. No anticoag PTA. CBC is ok. SCr mildly elevated.    Goal of Therapy:  Heparin level 0.3-0.7 units/ml Monitor platelets by anticoagulation protocol: Yes   Plan:  Add-on baseline aPTT if possible. Start heparin infusion at  1400units/hr. (No bolus d/t Lovenox given this AM) Check heparin level in 6hrs and daily thereafter. Check CBC q24h while on heparin. F/u daily.  Romeo Rabon, PharmD, pager [phone]. 10/27/2014,10:54 AM.

## 2014-10-27 NOTE — ED Provider Notes (Signed)
CSN: [id number]     Arrival date & time 10/26/14  2354 History   First MD Initiated Contact with Patient 10/27/14 0102     Chief Complaint  Patient presents with  . Shortness of Breath  . Weakness     (Consider location/radiation/quality/duration/timing/severity/associated sxs/prior Treatment) HPI Patient presents with shortness of breath, generalized weakness, chills and diaphoresis that started this evening. He denies any chest pain. He's had no fever. He reports the shortness of breath is worse with lying flat and exertion. He denies cough. He's had mild lower extremity swelling and weight gain over the last few weeks. Patient is on Lasix but does not know his dosage. Do not take medication today. No known sick contacts. Past Medical History  Diagnosis Date  . Hypertension   . Diabetes mellitus without complication   . Sleep apnea    History reviewed. No pertinent past surgical history. No family history on file. History  Substance Use Topics  . Smoking status: Current Every Day Smoker -- 0.50 packs/day    Types: Cigarettes  . Smokeless tobacco: Not on file  . Alcohol Use: No    Review of Systems  Constitutional: Positive for chills and diaphoresis. Negative for fever.  HENT: Negative for congestion, sinus pressure and sore throat.   Respiratory: Positive for shortness of breath. Negative for cough.   Cardiovascular: Positive for palpitations and leg swelling. Negative for chest pain.  Gastrointestinal: Negative for nausea, vomiting, abdominal pain, diarrhea and constipation.  Musculoskeletal: Negative for myalgias, back pain, neck pain and neck stiffness.  Skin: Negative for rash and wound.  Neurological: Negative for dizziness, weakness, light-headedness, numbness and headaches.  All other systems reviewed and are negative.     Allergies  Penicillins and Sulfa antibiotics  Home Medications   Prior to Admission medications   Not on File   BP 157/67 mmHg  Pulse  76  Temp(Src) 98.4 F (36.9 C) (Oral)  Resp 22  SpO2 94% Physical Exam  Constitutional: He is oriented to person, place, and time. He appears well-developed and well-nourished. No distress.  HENT:  Head: Normocephalic and atraumatic.  Mouth/Throat: Oropharynx is clear and moist.  Eyes: EOM are normal. Pupils are equal, round, and reactive to light.  Neck: Normal range of motion. Neck supple.  Cardiovascular: Normal rate and regular rhythm.   Pulmonary/Chest: No respiratory distress. He has no wheezes. He has rales (scattered bibasilar rails).  Mild increased respiratory effort.  Abdominal: Soft. Bowel sounds are normal. He exhibits no distension and no mass. There is no tenderness. There is no rebound and no guarding.  Musculoskeletal: Normal range of motion. He exhibits edema. He exhibits no tenderness.  2+ bilateral lower extremity pitting edema at the ankles. No swelling or tenderness  Neurological: He is alert and oriented to person, place, and time.  Skin: Skin is warm and dry. No rash noted. No erythema.  Psychiatric: He has a normal mood and affect. His behavior is normal.  Nursing note and vitals reviewed.   ED Course  Procedures (including critical care time) Labs Review Labs Reviewed  BASIC METABOLIC PANEL - Abnormal; Notable for the following:    Glucose, Bld 208 (*)    GFR calc non Af Amer 61 (*)    GFR calc Af Amer 71 (*)    All other components within normal limits  CBC - Abnormal; Notable for the following:    WBC 15.1 (*)    RBC 3.53 (*)    Hemoglobin 9.5 (*)  HCT 31.0 (*)    RDW 16.0 (*)    All other components within normal limits  BRAIN NATRIURETIC PEPTIDE - Abnormal; Notable for the following:    B Natriuretic Peptide 336.1 (*)    All other components within normal limits  I-STAT TROPOININ, ED    Imaging Review Dg Chest 2 View (if Patient Has Fever And/or Copd)  10/27/2014   CLINICAL DATA:  Acute onset of shortness of breath and weakness. Cough.  Initial encounter.  EXAM: CHEST  2 VIEW  COMPARISON:  None.  FINDINGS: The lungs are well-aerated. Vascular congestion is noted, with increased interstitial markings, concerning for mild pulmonary edema. There is no evidence of pleural effusion or pneumothorax.  The heart is mildly enlarged. No acute osseous abnormalities are seen.  IMPRESSION: Vascular congestion and mild cardiomegaly, with increased interstitial markings, concerning for mild pulmonary edema.   Electronically Signed   By: Garald Balding M.D.   On: 10/27/2014 01:12     EKG Interpretation   Date/Time:  Saturday October 27 2014 00:15:21 EST Ventricular Rate:  91 PR Interval:  155 QRS Duration: 105 QT Interval:  403 QTC Calculation: 496 R Axis:   55 Text Interpretation:  Sinus rhythm Borderline prolonged QT interval  Confirmed by Lita Mains  MD, Bowdy Bair ([id number]) on 10/27/2014 1:30:30 AM      MDM   Final diagnoses:  Acute pulmonary edema  Hypoxia    Clinical picture and chest x-ray concerning for pulmonary edema. Patient admits to missing his Lasix dose. We'll give IV Lasix and continue to monitor. Patient is requiring supplemental oxygen to maintain oxygen saturations.  Discussed with Dr. Posey Pronto of [facility]. Will admit patient to a telemetry bed.  Julianne Rice, MD 10/27/14 [phone]

## 2014-10-27 NOTE — H&P (Signed)
[facility] History and Physical  Patient: Derek Ibarra  [id number]  DOB: 1955-10-25  DOS: the patient was seen and examined on 10/27/2014 PCP: No primary care provider on file.  Chief Complaint: Shortness of breath and cough with chills  HPI: Derek Ibarra is a 59 y.o. male with Past medical history of hypertension, diabetes mellitus, sleep apnea on C Pap, G6PD deficiency, possible chronic diastolic heart failure. The patient is presenting with complaints of shortness of breath and cough. He mentions that on December 20 he was diagnosed with a pneumonia and was given 5 days of antibiotic course by [place]. He was not hospitalized. Since he started having symptoms of pneumonia he has progressively worsening shortness of breath with generalized weakness. Discussed initially improved but he started having cough again. He mentions he has gained 10 pounds since he started having pneumonia or denies any complaints of orthopnea or PND. He mentions he is compliant with his C Pap. He denies any vomiting complains of nausea. Denies any choking episode denies any travel outside. He denies any leg swelling. Denies any diarrhea constipation active bleeding. He mentions is compliant with all his medications.  The patient is coming from home. And at his baseline independent for most of his ADL.  Review of Systems: as mentioned in the history of present illness.  A Comprehensive review of the other systems is negative.  Past Medical History  Diagnosis Date  . Hypertension   . Diabetes mellitus without complication   . Sleep apnea   . G6PD deficiency 10/27/2014  . OSA on CPAP     16 CM pressure   History reviewed. No pertinent past surgical history. Social History:  reports that he has been smoking Cigarettes.  He has been smoking about 1.00 pack per day. He does not have any smokeless tobacco history on file. He reports that he does not drink alcohol or use illicit drugs.  Allergies  Allergen  Reactions  . Sulfa Antibiotics   . Penicillins Rash    History reviewed. No pertinent family history.  Prior to Admission medications   Medication Sig Start Date End Date Taking? Authorizing Provider  albuterol (PROVENTIL HFA;VENTOLIN HFA) 108 (90 BASE) MCG/ACT inhaler Inhale into the lungs every 6 (six) hours as needed for wheezing or shortness of breath.   Yes Historical Provider, MD  amLODipine (NORVASC) 10 MG tablet Take 10 mg by mouth daily.   Yes Historical Provider, MD  aspirin EC 81 MG tablet Take 81 mg by mouth daily.   Yes Historical Provider, MD  furosemide (LASIX) 20 MG tablet Take 20 mg by mouth daily.   Yes Historical Provider, MD  glipiZIDE (GLUCOTROL) 10 MG tablet Take 20 mg by mouth 2 (two) times daily before a meal.   Yes Historical Provider, MD  hydrALAZINE (APRESOLINE) 100 MG tablet Take 100 mg by mouth 3 (three) times daily.   Yes Historical Provider, MD  insulin glargine (LANTUS) 100 UNIT/ML injection Inject 40 Units into the skin 2 (two) times daily.   Yes Historical Provider, MD  lisinopril (PRINIVIL,ZESTRIL) 40 MG tablet Take 40 mg by mouth daily.   Yes Historical Provider, MD  metoprolol (LOPRESSOR) 50 MG tablet Take 100 mg by mouth 2 (two) times daily.   Yes Historical Provider, MD  Multiple Vitamin (MULTIVITAMIN WITH MINERALS) TABS tablet Take 1 tablet by mouth daily.   Yes Historical Provider, MD  potassium chloride SA (K-DUR,KLOR-CON) 20 MEQ tablet Take 20 mEq by mouth daily.   Yes Historical Provider, MD  rosuvastatin (CRESTOR) 10 MG tablet Take 10 mg by mouth daily.   Yes Historical Provider, MD  sertraline (ZOLOFT) 100 MG tablet Take 100 mg by mouth daily.   Yes Historical Provider, MD    Physical Exam: Filed Vitals:   10/27/14 [id number] 10/27/14 0315 10/27/14 0356 10/27/14 0452  BP: 162/66 159/67 163/66   Pulse:  76 79   Temp:   98.7 F (37.1 C)   TempSrc:   Oral   Resp: 19 18 20    Weight:   160 kg (352 lb 11.8 oz)   SpO2: 96% 94% 98% 92%    General:  Alert, Awake and Oriented to Time, Place and Person. Appear in mild distress Eyes: PERRL ENT: Oral Mucosa clear moist. Neck:  Difficult to assess JVD Cardiovascular: S1 and S2 Present, no Murmur, Peripheral Pulses Present Respiratory: Bilateral Air entry equal and Decreased, bilateral basal crackles, expiratory wheezes Abdomen: Bowel Sound present, Soft and non tender Skin: no Rash Extremities: no Pedal edema, no calf tenderness Neurologic: Grossly no focal neuro deficit.  Labs on Admission:  CBC:  Recent Labs Lab 10/27/14 0056  WBC 15.1*  HGB 9.5*  HCT 31.0*  MCV 87.8  PLT 255    CMP     Component Value Date/Time   NA 140 10/27/2014 0056   K 3.6 10/27/2014 0056   CL 108 10/27/2014 0056   CO2 24 10/27/2014 0056   GLUCOSE 208* 10/27/2014 0056   BUN 15 10/27/2014 0056   CREATININE 1.24 10/27/2014 0056   CALCIUM 8.7 10/27/2014 0056   GFRNONAA 61* 10/27/2014 0056   GFRAA 71* 10/27/2014 0056    No results for input(s): LIPASE, AMYLASE in the last 168 hours. No results for input(s): AMMONIA in the last 168 hours.  No results for input(s): CKTOTAL, CKMB, CKMBINDEX, TROPONINI in the last 168 hours. BNP (last 3 results) No results for input(s): PROBNP in the last 8760 hours.  Radiological Exams on Admission: Dg Chest 2 View (if Patient Has Fever And/or Copd)  10/27/2014   CLINICAL DATA:  Acute onset of shortness of breath and weakness. Cough. Initial encounter.  EXAM: CHEST  2 VIEW  COMPARISON:  None.  FINDINGS: The lungs are well-aerated. Vascular congestion is noted, with increased interstitial markings, concerning for mild pulmonary edema. There is no evidence of pleural effusion or pneumothorax.  The heart is mildly enlarged. No acute osseous abnormalities are seen.  IMPRESSION: Vascular congestion and mild cardiomegaly, with increased interstitial markings, concerning for mild pulmonary edema.   Electronically Signed   By: Garald Balding M.D.   On: 10/27/2014 01:12     EKG: Independently reviewed. normal sinus rhythm, nonspecific ST and T waves changes.  Assessment/Plan Principal Problem:   Acute respiratory failure with hypoxemia Active Problems:   Pulmonary edema   G6PD deficiency   Diabetes mellitus type 2 in obese   Essential hypertension   Smoker   1. Acute respiratory failure with hypoxemia The patient is presenting with compressive progressively worsening shortness of breath with cough. He is found to have bilateral basal crackles as well as bilateral expiratory wheezing. He gives a history of weight gain but does not have any orthopnea or PND. He also gives history of fever and chills with cough and expectoration. He does not use any oxygen at home and currently appears in significant respiratory distress and is hypoxic requiring oxygen. At present his picture is mixed with possible acute on chronic diastolic heart failure as well as possible acute on chronic bronchitis.  With this the patient will be admitted on telemetry. Patient has received IV Lasix and I would continue him on IV Lasix. Obtain echo cardiogram in the morning. Monitor oxygen as needed. Also treat the patient with ceftriaxone, azithromycin, Solu-Medrol, DuoNeb, Mucinex, flutter device for possible acute bronchitis. Follow influenza PCR as well as sputum cultures.  2. Diabetes mellitus type 2. Patient will be placed on sliding scale and continue with home Lantus.  3. Hypertension. Continue home medications.  4. Active smoker. Nicotine patch. Patient was counseled against smoking.  Advance goals of care discussion:  Full code   DVT Prophylaxis: subcutaneous Heparin Nutrition: Cardiac and diabetic diet  Family Communication:  Son was present at bedside, opportunity was given to ask question and all questions were answered satisfactorily at the time of interview. Disposition: Admitted to inpatient in telemetry unit.  Author: Berle Mull, MD [facility] Pager: [phone] 10/27/2014, 6:36 AM    If 7PM-7AM, please contact night-coverage [email or web address] Password [email or web address]

## 2014-10-27 NOTE — Progress Notes (Signed)
HS cbg is 405 had just ate some grapes and is on solumedrol - notified Kathline Magic NP on call, states do not do the stat lab draw as per protocol and give 6 Units Novolog instead of 5.

## 2014-10-27 NOTE — Progress Notes (Signed)
ANTICOAGULATION CONSULT NOTE - Follow up  Pharmacy Consult for heparin Indication: NSTEMI  Allergies  Allergen Reactions  . Sulfa Antibiotics   . Penicillins Rash    Patient Measurements: Height: 5\' 9"  (175.3 cm) Weight: (!) 352 lb 11.8 oz (160 kg) IBW/kg (Calculated) : 70.7 Heparin Dosing Weight: 110kg  Vital Signs: Temp: 98.3 F (36.8 C) (01/16 1510) Temp Source: Oral (01/16 1510) BP: 158/68 mmHg (01/16 1510) Pulse Rate: 83 (01/16 1510)  Labs:  Recent Labs  10/27/14 0056 10/27/14 0840 10/27/14 1435 10/27/14 1750  HGB 9.5* 9.8*  --   --   HCT 31.0* 32.5*  --   --   PLT 255 240  --   --   APTT  --  38*  --   --   LABPROT  --  13.8  --   --   INR  --  1.05  --   --   HEPARINUNFRC  --   --   --  0.14*  CREATININE 1.24 1.23  --   --   TROPONINI  --  0.13* 0.11*  --     Estimated Creatinine Clearance: 94.9 mL/min (by C-G formula based on Cr of 1.23).  Medications:  Prescriptions prior to admission  Medication Sig Dispense Refill Last Dose  . albuterol (PROVENTIL HFA;VENTOLIN HFA) 108 (90 BASE) MCG/ACT inhaler Inhale into the lungs every 6 (six) hours as needed for wheezing or shortness of breath.   10/26/2014 at Unknown time  . amLODipine (NORVASC) 10 MG tablet Take 10 mg by mouth daily.   10/26/2014 at Unknown time  . aspirin EC 81 MG tablet Take 81 mg by mouth daily.   10/26/2014 at Unknown time  . furosemide (LASIX) 20 MG tablet Take 20 mg by mouth daily.   10/26/2014 at Unknown time  . glipiZIDE (GLUCOTROL) 10 MG tablet Take 20 mg by mouth 2 (two) times daily before a meal.   10/26/2014 at Unknown time  . hydrALAZINE (APRESOLINE) 100 MG tablet Take 100 mg by mouth 3 (three) times daily.   10/26/2014 at Unknown time  . insulin glargine (LANTUS) 100 UNIT/ML injection Inject 40 Units into the skin 2 (two) times daily.   10/26/2014 at Unknown time  . lisinopril (PRINIVIL,ZESTRIL) 40 MG tablet Take 40 mg by mouth daily.   10/26/2014 at Unknown time  . metoprolol (LOPRESSOR)  50 MG tablet Take 100 mg by mouth 2 (two) times daily.   10/26/2014 at 2230  . Multiple Vitamin (MULTIVITAMIN WITH MINERALS) TABS tablet Take 1 tablet by mouth daily.   10/26/2014 at Unknown time  . potassium chloride SA (K-DUR,KLOR-CON) 20 MEQ tablet Take 20 mEq by mouth daily.   10/26/2014 at Unknown time  . rosuvastatin (CRESTOR) 10 MG tablet Take 10 mg by mouth daily.   10/26/2014 at Unknown time  . sertraline (ZOLOFT) 100 MG tablet Take 100 mg by mouth daily.   10/26/2014 at Unknown time   Scheduled:  . amLODipine  10 mg Oral Daily  . antiseptic oral rinse  7 mL Mouth Rinse q12n4p  . aspirin EC  81 mg Oral Daily  . azithromycin  500 mg Intravenous Q24H  . cefTRIAXone (ROCEPHIN)  IV  1 g Intravenous Q24H  . chlorhexidine  15 mL Mouth Rinse BID  . furosemide  40 mg Intravenous Daily  . guaiFENesin  600 mg Oral BID  . hydrALAZINE  100 mg Oral TID  . insulin aspart  0-15 Units Subcutaneous TID WC  . insulin aspart  0-5 Units Subcutaneous QHS  . insulin glargine  40 Units Subcutaneous BID  . ipratropium-albuterol  3 mL Nebulization Q6H WA  . lisinopril  40 mg Oral Daily  . methylPREDNISolone (SOLU-MEDROL) injection  60 mg Intravenous Q12H  . metoprolol  100 mg Oral BID  . nicotine  14 mg Transdermal Daily  . potassium chloride  20 mEq Oral Daily  . rosuvastatin  10 mg Oral Daily  . sertraline  100 mg Oral Daily  . sodium chloride  3 mL Intravenous Q12H   Infusions:  . heparin 1,400 Units/hr (10/27/14 1128)   Assessment: 45 yoM c/o SOB with weakness, chills and sweats.Pharmacy is asked to start heparin infusion for NSTEMI. Lovenox 80mg  was given at 1030 today as weight-adjusted prophylactic dose. PT/INR wnl. No anticoag PTA. CBC is ok. SCr mildly elevated.    1/16: HL 0.14, subtherapeutic on 1400units/hr. No problems per RN.  Goal of Therapy:  Heparin level 0.3-0.7 units/ml Monitor platelets by anticoagulation protocol: Yes   Plan:  Give a heparin bolus of 3000units then  increase infusion to 1800units/hr. Recheck heparin level in 8hrs. Check CBC q24h while on heparin. F/u daily.  Romeo Rabon, PharmD, pager [phone]. 10/27/2014,6:48 PM.

## 2014-10-28 DIAGNOSIS — G4730 Sleep apnea, unspecified: Secondary | ICD-10-CM

## 2014-10-28 DIAGNOSIS — E11319 Type 2 diabetes mellitus with unspecified diabetic retinopathy without macular edema: Secondary | ICD-10-CM

## 2014-10-28 DIAGNOSIS — R7989 Other specified abnormal findings of blood chemistry: Secondary | ICD-10-CM

## 2014-10-28 LAB — CBC
HCT: 29.4 % — ABNORMAL LOW (ref 39.0–52.0)
Hemoglobin: 9.1 g/dL — ABNORMAL LOW (ref 13.0–17.0)
MCH: 26.7 pg (ref 26.0–34.0)
MCHC: 31 g/dL (ref 30.0–36.0)
MCV: 86.2 fL (ref 78.0–100.0)
PLATELETS: 200 10*3/uL (ref 150–400)
RBC: 3.41 MIL/uL — AB (ref 4.22–5.81)
RDW: 15.9 % — ABNORMAL HIGH (ref 11.5–15.5)
WBC: 17 10*3/uL — ABNORMAL HIGH (ref 4.0–10.5)

## 2014-10-28 LAB — BASIC METABOLIC PANEL
Anion gap: 7 (ref 5–15)
BUN: 25 mg/dL — AB (ref 6–23)
CO2: 24 mmol/L (ref 19–32)
CREATININE: 1.42 mg/dL — AB (ref 0.50–1.35)
Calcium: 8.8 mg/dL (ref 8.4–10.5)
Chloride: 104 mEq/L (ref 96–112)
GFR calc Af Amer: 60 mL/min — ABNORMAL LOW (ref >90)
GFR calc non Af Amer: 52 mL/min — ABNORMAL LOW (ref >90)
Glucose, Bld: 291 mg/dL — ABNORMAL HIGH (ref 70–99)
Potassium: 3.5 mmol/L (ref 3.5–5.1)
Sodium: 135 mmol/L (ref 135–145)

## 2014-10-28 LAB — GLUCOSE, CAPILLARY
GLUCOSE-CAPILLARY: 317 mg/dL — AB (ref 70–99)
GLUCOSE-CAPILLARY: 258 mg/dL — AB (ref 70–99)
GLUCOSE-CAPILLARY: 245 mg/dL — AB (ref 70–99)
Glucose-Capillary: 291 mg/dL — ABNORMAL HIGH (ref 70–99)

## 2014-10-28 LAB — MAGNESIUM: Magnesium: 2 mg/dL (ref 1.5–2.5)

## 2014-10-28 LAB — HEPARIN LEVEL (UNFRACTIONATED): Heparin Unfractionated: 0.35 IU/mL (ref 0.30–0.70)

## 2014-10-28 MED ORDER — POTASSIUM CHLORIDE CRYS ER 20 MEQ PO TBCR
20.0000 meq | EXTENDED_RELEASE_TABLET | Freq: Once | ORAL | Status: AC
  Administered 2014-10-28: 20 meq via ORAL
  Filled 2014-10-28: qty 1

## 2014-10-28 MED ORDER — HEPARIN (PORCINE) IN NACL 100-0.45 UNIT/ML-% IJ SOLN
1900.0000 [IU]/h | INTRAMUSCULAR | Status: DC
  Filled 2014-10-28: qty 250

## 2014-10-28 MED ORDER — FUROSEMIDE 40 MG PO TABS
40.0000 mg | ORAL_TABLET | Freq: Every day | ORAL | Status: DC
  Administered 2014-10-28 – 2014-10-29 (×2): 40 mg via ORAL
  Filled 2014-10-28 (×2): qty 1

## 2014-10-28 MED ORDER — HEPARIN SODIUM (PORCINE) 5000 UNIT/ML IJ SOLN
5000.0000 [IU] | Freq: Three times a day (TID) | INTRAMUSCULAR | Status: DC
  Administered 2014-10-28 – 2014-10-29 (×3): 5000 [IU] via SUBCUTANEOUS
  Filled 2014-10-28 (×6): qty 1

## 2014-10-28 MED ORDER — CEFPODOXIME PROXETIL 200 MG PO TABS
200.0000 mg | ORAL_TABLET | Freq: Two times a day (BID) | ORAL | Status: DC
Start: 2014-10-28 — End: 2014-10-29
  Administered 2014-10-28 – 2014-10-29 (×2): 200 mg via ORAL
  Filled 2014-10-28 (×3): qty 1

## 2014-10-28 MED ORDER — PREDNISONE 50 MG PO TABS
60.0000 mg | ORAL_TABLET | Freq: Every day | ORAL | Status: DC
  Administered 2014-10-29: 60 mg via ORAL
  Filled 2014-10-28 (×2): qty 1

## 2014-10-28 NOTE — Progress Notes (Signed)
Pts ambulating sats were 99% on room air, tolerated with no SOB.

## 2014-10-28 NOTE — Progress Notes (Signed)
  Echocardiogram 2D Echocardiogram has been performed.  Lysle Rubens 10/28/2014, 9:12 AM

## 2014-10-28 NOTE — Progress Notes (Signed)
[facility] PROGRESS NOTE  Derek Ibarra [id number] DOB: 08/20/1956 DOA: 10/26/2014  PCP: Patient sees his PCP at [facility] HPI: 59 year old African-American male with a history of hypertension, diabetes, sleep apnea, presented with the shortness of breath. He had recently finished a course of antibiotics for pneumonia in December. He felt better. And now presented with dyspnea and orthopnea. He was admitted for further management.  Past medical history:  Past Medical History  Diagnosis Date  . Hypertension   . Sleep apnea   . G6PD deficiency   . OSA on CPAP     16 CM pressure  . Hypertensive heart disease   . Morbid obesity     Consultants: Cardiology  Procedures:  Echocardiogram is pending  Antibiotics: Ceftriaxone and azithromycin  Subjective: Patient feels much better. Denies any chest pain, shortness of breath, nausea, vomiting. Wants to go home.   Objective: Vital Signs  Filed Vitals:   10/27/14 2031 10/27/14 2106 10/28/14 0639 10/28/14 0928  BP:  158/67 146/60   Pulse: 80 83 78   Temp:  97.6 F (36.4 C) 97.7 F (36.5 C)   TempSrc:  Oral Oral   Resp: 20 20 20    Height:      Weight:   134.99 kg (297 lb 9.6 oz) 134.854 kg (297 lb 4.8 oz)  SpO2: 99% 97% 100%     Intake/Output Summary (Last 24 hours) at 10/28/14 0955 Last data filed at 10/28/14 0640  Gross per 24 hour  Intake   2212 ml  Output   1820 ml  Net    392 ml   Filed Weights   10/27/14 0356 10/28/14 0639 10/28/14 0928  Weight: 160 kg (352 lb 11.8 oz) 134.99 kg (297 lb 9.6 oz) 134.854 kg (297 lb 4.8 oz)    General appearance: alert, cooperative, appears stated age, no distress and morbidly obese Resp: Much improved air entry. No crackles today. No wheezing. No rhonchi. Cardio: regular rate and rhythm, S1, S2 normal, no murmur, click, rub or gallop GI: soft, non-tender; bowel sounds normal; no masses,  no organomegaly Extremities: Minimal edema noted bilateral lower  extremities. Neurologic: No focal deficits  Lab Results:  Basic Metabolic Panel:  Recent Labs Lab 10/27/14 0056 10/27/14 0840 10/27/14 0842 10/28/14 0302  NA 140 138  --  135  K 3.6 3.2*  --  3.5  CL 108 104  --  104  CO2 24 25  --  24  GLUCOSE 208* 243*  --  291*  BUN 15 15  --  25*  CREATININE 1.24 1.23  --  1.42*  CALCIUM 8.7 8.9  --  8.8  MG  --   --  1.9 2.0   Liver Function Tests:  Recent Labs Lab 10/27/14 0840  AST 29  ALT 21  ALKPHOS 95  BILITOT 0.9  PROT 8.3  ALBUMIN 3.9   CBC:  Recent Labs Lab 10/27/14 0056 10/27/14 0840 10/28/14 0302  WBC 15.1* 13.5* 17.0*  NEUTROABS  --  12.7*  --   HGB 9.5* 9.8* 9.1*  HCT 31.0* 32.5* 29.4*  MCV 87.8 87.1 86.2  PLT 255 240 200   Cardiac Enzymes:  Recent Labs Lab 10/27/14 0840 10/27/14 1435 10/27/14 2029  TROPONINI 0.13* 0.11* 0.08*   CBG:  Recent Labs Lab 10/27/14 0800 10/27/14 1155 10/27/14 1730 10/27/14 2101 10/28/14 0751  GLUCAP 206* 311* 293* 405* 317*     Studies/Results: Dg Chest 2 View (if Patient Has Fever And/or Copd)  10/27/2014   CLINICAL DATA:  Acute onset of shortness of breath and weakness. Cough. Initial encounter.  EXAM: CHEST  2 VIEW  COMPARISON:  None.  FINDINGS: The lungs are well-aerated. Vascular congestion is noted, with increased interstitial markings, concerning for mild pulmonary edema. There is no evidence of pleural effusion or pneumothorax.  The heart is mildly enlarged. No acute osseous abnormalities are seen.  IMPRESSION: Vascular congestion and mild cardiomegaly, with increased interstitial markings, concerning for mild pulmonary edema.   Electronically Signed   By: Garald Balding M.D.   On: 10/27/2014 01:12    Medications:  Scheduled: . amLODipine  10 mg Oral Daily  . antiseptic oral rinse  7 mL Mouth Rinse q12n4p  . aspirin EC  81 mg Oral Daily  . azithromycin  500 mg Intravenous Q24H  . cefTRIAXone (ROCEPHIN)  IV  1 g Intravenous Q24H  . chlorhexidine  15  mL Mouth Rinse BID  . furosemide  40 mg Oral Daily  . guaiFENesin  600 mg Oral BID  . heparin subcutaneous  5,000 Units Subcutaneous 3 times per day  . hydrALAZINE  100 mg Oral TID  . insulin aspart  0-15 Units Subcutaneous TID WC  . insulin aspart  0-5 Units Subcutaneous QHS  . insulin glargine  40 Units Subcutaneous BID  . ipratropium-albuterol  3 mL Nebulization Q6H WA  . methylPREDNISolone (SOLU-MEDROL) injection  60 mg Intravenous Q12H  . metoprolol  100 mg Oral BID  . nicotine  14 mg Transdermal Daily  . potassium chloride  20 mEq Oral Daily  . rosuvastatin  10 mg Oral Daily  . sertraline  100 mg Oral Daily  . 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 X-ray was suggestive of pulmonary edema. He finished a course of antibiotics for pneumonia in December. No clearcut infiltrate noted on chest x-ray. Considering that he is improved with Lasix this does appear to be pulmonary edema. Could've had superimposed bronchitis. Continue current management. Continue antibiotics for now considering elevated WBC, but changed to oral. Influenza PCR is negative. He is on steroids which will be continued for now. Change to oral. Consider quick taper  Acute Pulmonary edema with elevated troponin Workup is in progress. Echocardiogram is pending. Continue with lasix daily but will be changed to oral. Replace potassium. Magnesium was normal.  EKG did suggest subtle ST changes in V5, V6. Continue aspirin. Elevated troponin, not significant. Unlikely to be  nSTEMI. Stop heparin. Cardiology has seen. They will determine further plan tomorrow. Considering his risk factors, he will need ischemic workup either as inpatient or outpatient. Continue statin.  Mildly elevated creatinine Likely due to diuresis. Change to oral Lasix and repeat renal function in the morning. Hold ACE inhibitor for now.  Diabetes mellitus type 2 with hyperglycemia Elevated blood sugar secondary to steroids. Should improve as steroid is tapered down. Continue sliding scale coverage and Lantus. HbA1c is 6.6  History of essential Hypertension. Continue home medications including metoprolol, hydralazine and amlodipine. Hold lisinopril due to elevated creatinine  Normocytic anemia Hemoglobin is stable. No overt  bleeding.  Obstructive sleep apnea Continue CPAP  Active smoker Nicotine patch. Patient was counseled against smoking.   DVT Prophylaxis: heparin subcutaneous Code Status: Full code  Family Communication: Discussed with the patient  Disposition Plan: Not ready for discharge.    LOS: 2 days   [facility] Pager [phone] 10/28/2014, 9:55 AM  If 7PM-7AM, please contact night-coverage at [email or web address], password [facility]

## 2014-10-28 NOTE — Progress Notes (Signed)
ANTICOAGULATION CONSULT NOTE - Follow up  Pharmacy Consult for heparin Indication: NSTEMI  Allergies  Allergen Reactions  . Sulfa Antibiotics   . Penicillins Rash    Patient Measurements: Height: 5\' 9"  (175.3 cm) Weight: (!) 352 lb 11.8 oz (160 kg) IBW/kg (Calculated) : 70.7 Heparin Dosing Weight: 110kg  Vital Signs: Temp: 97.6 F (36.4 C) (01/16 2106) Temp Source: Oral (01/16 2106) BP: 158/67 mmHg (01/16 2106) Pulse Rate: 83 (01/16 2106)  Labs:  Recent Labs  10/27/14 0056 10/27/14 0840 10/27/14 1435 10/27/14 1750 10/27/14 2029 10/28/14 0300 10/28/14 0302  HGB 9.5* 9.8*  --   --   --   --  9.1*  HCT 31.0* 32.5*  --   --   --   --  29.4*  PLT 255 240  --   --   --   --  200  APTT  --  38*  --   --   --   --   --   LABPROT  --  13.8  --   --   --   --   --   INR  --  1.05  --   --   --   --   --   HEPARINUNFRC  --   --   --  0.14*  --  0.35  --   CREATININE 1.24 1.23  --   --   --   --  1.42*  TROPONINI  --  0.13* 0.11*  --  0.08*  --   --     Estimated Creatinine Clearance: 82.2 mL/min (by C-G formula based on Cr of 1.42).  Medications:  Prescriptions prior to admission  Medication Sig Dispense Refill Last Dose  . albuterol (PROVENTIL HFA;VENTOLIN HFA) 108 (90 BASE) MCG/ACT inhaler Inhale into the lungs every 6 (six) hours as needed for wheezing or shortness of breath.   10/26/2014 at Unknown time  . amLODipine (NORVASC) 10 MG tablet Take 10 mg by mouth daily.   10/26/2014 at Unknown time  . aspirin EC 81 MG tablet Take 81 mg by mouth daily.   10/26/2014 at Unknown time  . furosemide (LASIX) 20 MG tablet Take 20 mg by mouth daily.   10/26/2014 at Unknown time  . glipiZIDE (GLUCOTROL) 10 MG tablet Take 20 mg by mouth 2 (two) times daily before a meal.   10/26/2014 at Unknown time  . hydrALAZINE (APRESOLINE) 100 MG tablet Take 100 mg by mouth 3 (three) times daily.   10/26/2014 at Unknown time  . insulin glargine (LANTUS) 100 UNIT/ML injection Inject 40 Units into the  skin 2 (two) times daily.   10/26/2014 at Unknown time  . lisinopril (PRINIVIL,ZESTRIL) 40 MG tablet Take 40 mg by mouth daily.   10/26/2014 at Unknown time  . metoprolol (LOPRESSOR) 50 MG tablet Take 100 mg by mouth 2 (two) times daily.   10/26/2014 at 2230  . Multiple Vitamin (MULTIVITAMIN WITH MINERALS) TABS tablet Take 1 tablet by mouth daily.   10/26/2014 at Unknown time  . potassium chloride SA (K-DUR,KLOR-CON) 20 MEQ tablet Take 20 mEq by mouth daily.   10/26/2014 at Unknown time  . rosuvastatin (CRESTOR) 10 MG tablet Take 10 mg by mouth daily.   10/26/2014 at Unknown time  . sertraline (ZOLOFT) 100 MG tablet Take 100 mg by mouth daily.   10/26/2014 at Unknown time   Scheduled:  . amLODipine  10 mg Oral Daily  . antiseptic oral rinse  7 mL Mouth Rinse q12n4p  .  aspirin EC  81 mg Oral Daily  . azithromycin  500 mg Intravenous Q24H  . cefTRIAXone (ROCEPHIN)  IV  1 g Intravenous Q24H  . chlorhexidine  15 mL Mouth Rinse BID  . furosemide  40 mg Intravenous Daily  . guaiFENesin  600 mg Oral BID  . hydrALAZINE  100 mg Oral TID  . insulin aspart  0-15 Units Subcutaneous TID WC  . insulin aspart  0-5 Units Subcutaneous QHS  . insulin glargine  40 Units Subcutaneous BID  . ipratropium-albuterol  3 mL Nebulization Q6H WA  . lisinopril  40 mg Oral Daily  . methylPREDNISolone (SOLU-MEDROL) injection  60 mg Intravenous Q12H  . metoprolol  100 mg Oral BID  . nicotine  14 mg Transdermal Daily  . potassium chloride  20 mEq Oral Daily  . rosuvastatin  10 mg Oral Daily  . sertraline  100 mg Oral Daily  . sodium chloride  3 mL Intravenous Q12H   Infusions:  . heparin     Assessment: 65 yoM c/o SOB with weakness, chills and sweats.Pharmacy is asked to start heparin infusion for NSTEMI. Lovenox 80mg  was given at 1030 today as weight-adjusted prophylactic dose. PT/INR wnl. No anticoag PTA. CBC is ok. SCr mildly elevated.    1/16: HL 0.14, subtherapeutic on 1400units/hr. No problems per RN. Today,  1/17: 0300 HL=0.35, no problems per RN, will increase slightly to prevent drop as bolus wears off.   Goal of Therapy:  Heparin level 0.3-0.7 units/ml Monitor platelets by anticoagulation protocol: Yes   Plan:   Increase heparin drip to 1900 units/hr  Recheck heparin level in 6hrs.  Check CBC q24h while on heparin.  F/u daily.  Lawana Pai R  10/28/2014,4:10 AM.

## 2014-10-28 NOTE — Progress Notes (Signed)
Subjective:  Doing better now with no shortness of breath.  Echo currently being done and preliminary report at bedside shows EF around 60-65% with marked LVH and left atrial enlargement.  He does report only borderline control of blood pressure in the past.  Weights are very unreliable.  We'll ask the nurse to reweigh.  Objective:  Vital Signs in the last 24 hours: BP 146/60 mmHg  Pulse 78  Temp(Src) 97.7 F (36.5 C) (Oral)  Resp 20  Ht 5\' 9"  (1.753 m)  Wt 134.99 kg (297 lb 9.6 oz)  BMI 43.93 kg/m2  SpO2 100%  Physical Exam: Very large, obese black male in no acute distress Lungs:  Clear Cardiac:  Regular rhythm, normal S1 and S2, no S3 Extremities:  1+ edema present  Intake/Output from previous day: 01/16 0701 - 01/17 0700 In: 2212 [P.O.:2140; I.V.:72] Out: 2120 [Urine:2120]  Weight Filed Weights   10/27/14 0356 10/28/14 0639  Weight: 160 kg (352 lb 11.8 oz) 134.99 kg (297 lb 9.6 oz)    Lab Results: Basic Metabolic Panel:  Recent Labs  10/27/14 0840 10/28/14 0302  NA 138 135  K 3.2* 3.5  CL 104 104  CO2 25 24  GLUCOSE 243* 291*  BUN 15 25*  CREATININE 1.23 1.42*   CBC:  Recent Labs  10/27/14 0840 10/28/14 0302  WBC 13.5* 17.0*  NEUTROABS 12.7*  --   HGB 9.8* 9.1*  HCT 32.5* 29.4*  MCV 87.1 86.2  PLT 240 200   Cardiac Enzymes:  Recent Labs  10/27/14 0840 10/27/14 1435 10/27/14 2029  TROPONINI 0.13* 0.11* 0.08*    Telemetry: Sinus rhythm  Assessment/Plan:  1.  Acute on chronic diastolic heart failure appears clinically improved.  His creatinine is going up, so may have to back off on diuresis a little bit 2.  Apparently hypertensive heart disease. 3.  Stage III chronic kidney disease likely due to diabetes and hypertension. 4.  Severe obesity. 5.  Low level troponin elevations likely due to congestive heart failure-does not fit the clinical pattern of a non-STEMI. 6.  Anemia of uncertain cause  Recommendations:  May need to cut back  diuresis since creatinine is going up.  I'm going to have the nurse reweigh the patient.  I think at this point he could be changed to oral diuretics and continue to follow renal function.  He will need some assessment for ischemia.  It could either be done here or at the [place] but in light of his weight will need to be a 2 day nuclear study.  In light of his renal function and lack of ischemic symptoms, probably start with a noninvasive test first.  Needs a few days more to recover from either pneumonia or heart failure     W. Doristine Church  MD [facility]  10/28/2014, 8:43 AM

## 2014-10-29 DIAGNOSIS — I5032 Chronic diastolic (congestive) heart failure: Secondary | ICD-10-CM

## 2014-10-29 DIAGNOSIS — I110 Hypertensive heart disease with heart failure: Secondary | ICD-10-CM

## 2014-10-29 DIAGNOSIS — I5031 Acute diastolic (congestive) heart failure: Secondary | ICD-10-CM

## 2014-10-29 DIAGNOSIS — I509 Heart failure, unspecified: Secondary | ICD-10-CM

## 2014-10-29 LAB — BASIC METABOLIC PANEL
Anion gap: 9 (ref 5–15)
BUN: 32 mg/dL — AB (ref 6–23)
CHLORIDE: 108 meq/L (ref 96–112)
CO2: 27 mmol/L (ref 19–32)
Calcium: 9.1 mg/dL (ref 8.4–10.5)
Creatinine, Ser: 1.4 mg/dL — ABNORMAL HIGH (ref 0.50–1.35)
GFR calc non Af Amer: 53 mL/min — ABNORMAL LOW (ref >90)
GFR, EST AFRICAN AMERICAN: 61 mL/min — AB (ref >90)
Glucose, Bld: 182 mg/dL — ABNORMAL HIGH (ref 70–99)
POTASSIUM: 3.7 mmol/L (ref 3.5–5.1)
SODIUM: 144 mmol/L (ref 135–145)

## 2014-10-29 LAB — CBC
HEMATOCRIT: 30.1 % — AB (ref 39.0–52.0)
Hemoglobin: 9.1 g/dL — ABNORMAL LOW (ref 13.0–17.0)
MCH: 26.2 pg (ref 26.0–34.0)
MCHC: 30.2 g/dL (ref 30.0–36.0)
MCV: 86.7 fL (ref 78.0–100.0)
Platelets: 210 10*3/uL (ref 150–400)
RBC: 3.47 MIL/uL — AB (ref 4.22–5.81)
RDW: 16 % — ABNORMAL HIGH (ref 11.5–15.5)
WBC: 16.6 10*3/uL — ABNORMAL HIGH (ref 4.0–10.5)

## 2014-10-29 LAB — GLUCOSE, CAPILLARY: Glucose-Capillary: 142 mg/dL — ABNORMAL HIGH (ref 70–99)

## 2014-10-29 MED ORDER — FUROSEMIDE 20 MG PO TABS: 40.0000 mg | ORAL_TABLET | Freq: Every day | ORAL | Status: DC

## 2014-10-29 MED ORDER — PREDNISONE 20 MG PO TABS
ORAL_TABLET | ORAL | Status: DC
Start: 2014-10-29 — End: 2019-10-17

## 2014-10-29 MED ORDER — CEFPODOXIME PROXETIL 200 MG PO TABS: 200.0000 mg | ORAL_TABLET | Freq: Two times a day (BID) | ORAL | Status: DC

## 2014-10-29 NOTE — Plan of Care (Signed)
Problem: Discharge Progression Outcomes Goal: Pneumonia vaccine received if indicated Outcome: Not Met (add Reason) Patient refused. States he will get it at the [place]

## 2014-10-29 NOTE — Progress Notes (Signed)
Subjective:  Feels great. No SOB. No chest pain. Anxious to go home.   Objective:  Vital Signs in the last 24 hours: BP 137/73 mmHg  Pulse 55  Temp(Src) 97.5 F (36.4 C) (Oral)  Resp 20  Ht 5\' 9"  (1.753 m)  Wt 293 lb 12.8 oz (133.267 kg)  BMI 43.37 kg/m2  SpO2 99%  Physical Exam: obese black male in no acute distress Lungs:  Clear Cardiac:  Regular rhythm, normal S1 and S2, no S3 Extremities:  no edema present  Intake/Output from previous day: 01/17 0701 - 01/18 0700 In: 960 [P.O.:960] Out: 1300 [Urine:1300]  Weight Filed Weights   10/28/14 0639 10/28/14 0928 10/29/14 0601  Weight: 297 lb 9.6 oz (134.99 kg) 297 lb 4.8 oz (134.854 kg) 293 lb 12.8 oz (133.267 kg)    Lab Results: Basic Metabolic Panel:  Recent Labs  10/28/14 0302 10/29/14 0428  NA 135 144  K 3.5 3.7  CL 104 108  CO2 24 27  GLUCOSE 291* 182*  BUN 25* 32*  CREATININE 1.42* 1.40*   CBC:  Recent Labs  10/27/14 0840 10/28/14 0302 10/29/14 0428  WBC 13.5* 17.0* 16.6*  NEUTROABS 12.7*  --   --   HGB 9.8* 9.1* 9.1*  HCT 32.5* 29.4* 30.1*  MCV 87.1 86.2 86.7  PLT 240 200 210   Cardiac Enzymes:  Recent Labs  10/27/14 0840 10/27/14 1435 10/27/14 2029  TROPONINI 0.13* 0.11* 0.08*    Telemetry: Sinus rhythm  Assessment/Plan:  1.  Acute on chronic diastolic heart failure appears clinically improved.  His creatinine is stable 2. Hypertensive heart disease. 3.  Stage III chronic kidney disease likely due to diabetes and hypertension. 4.  Severe obesity. 5.  Low level troponin elevations likely due to congestive heart failure and demand ischemia-does not fit the clinical pattern of a non-STEMI. 6.  Anemia   Recommendations: Mr. Magill looks very good today and is clinically ready for DC. OK from a cardiac standpoint. We will arrange for a stress Myoview as an outpatient.     Matei Magnone [place] MD, [facility]    10/29/2014, 7:45 AM

## 2014-10-29 NOTE — Progress Notes (Signed)
I completed D/C teaching with the patient. He is leaving in stable condition with family.

## 2014-10-29 NOTE — Discharge Instructions (Signed)

## 2014-10-29 NOTE — Discharge Summary (Signed)
[facility]  Physician Discharge Summary   Patient ID: Derek Ibarra MRN: [id number] DOB/AGE: [birth year] 59 y.o.  Admit date: 10/26/2014 Discharge date: 10/29/2014  PCP: No primary care provider on file.  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 Cardiology to arrange outpatient stress test. 2. Instructed to have his renal function checked next week when he follows up with his providers at [facility].  DISCHARGE CONDITION: fair  Diet recommendation: Heart healthy/diabetic  Filed Weights   10/28/14 0639 10/28/14 0928 10/29/14 0601  Weight: 134.99 kg (297 lb 9.6 oz) 134.854 kg (297 lb 4.8 oz) 133.267 kg (293 lb 12.8 oz)    INITIAL HISTORY: 59 year old African-American male with a history of hypertension, diabetes, sleep apnea, presented with the shortness of breath. He had recently finished a course of antibiotics for pneumonia in December. He felt better. And now presented with dyspnea and orthopnea. He was admitted for further management.  Consultations:  Cardiology  Procedures: 2-D echocardiogram Study Conclusions - Left ventricle: The cavity size was normal. Wall thickness wasincreased in a pattern of moderate to severe LVH. Systolicfunction was normal. The estimated ejection fraction was in therange of 60% to 65%. Wall motion was normal; there were noregional wall motion abnormalities. Doppler parameters areconsistent with a reversible restrictive pattern, indicative ofdecreased left ventricular diastolic compliance and/or increasedleft atrial pressure (grade 3 diastolic dysfunction). - Left atrium: The atrium was moderately dilated. - Right  atrium: The atrium was mildly to moderately dilated. - Pericardium, extracardiac: A trivial pericardial effusion wasidentified  HOSPITAL COURSE:    Acute respiratory failure with hypoxemia This was thought to be mostly due to acute pulmonary edema. Patient had recently completed a course of antibiotics in December for pneumonia. He was given Lasix. He was also started on steroids and antibiotics for possible superimposed bronchitis. He quickly improved suggesting that it was more edema rather than an infectious process. Influenza PCR was negative. Antibiotics and steroids for a few more days.   Acute Pulmonary edema with elevated troponin/acute diastolic heart failure Echocardiogram shows diastolic heart failure. Seen by cardiology. He had minimal elevation in troponin for which he was initially started on intravenous heparin. Elevated troponins were thought more likely to be secondary to heart failure rather than a non-STEMI. He has continued and aspirin. Ischemic workup was considered, but can be done as an outpatient considering patient's improvement. This will be arranged by cardiology. Continue statin. He is on Lasix at home and the dose was increased.  Mildly elevated creatinine Likely due to diuresis. He was changed over to oral Lasix. Creatinine is stable to slightly improved today. Will need to have his metabolic panel checked next week when he has a follow-up with his doctors. Okay to resume ACE inhibitor.  Diabetes mellitus type 2 with hyperglycemia Elevated blood sugar secondary to steroids. Should improve as steroid is tapered down. Continue home medications. HbA1c is 6.6  History of essential Hypertension. Blood pressure is stable. He may continue his home medications.   Normocytic anemia Hemoglobin is stable. No overt bleeding.  Obstructive sleep apnea Continue CPAP  Active smoker Patient was counseled against smoking.  Patient remained stable. He has ambulated without  any  difficulties. Room air saturations are normal. He is keen on going home. He will be discharged today.    PERTINENT LABS:  The results of significant diagnostics from this hospitalization (including imaging, microbiology, ancillary and laboratory) are listed below for reference.    Microbiology: Recent Results (from the past 240 hour(s))  Culture, expectorated sputum-assessment     Status: None   Collection Time: 10/27/14  8:13 PM  Result Value Ref Range Status   Specimen Description SPUTUM  Final   Special Requests NONE  Final   Sputum evaluation   Final    THIS SPECIMEN IS ACCEPTABLE. RESPIRATORY CULTURE REPORT TO FOLLOW.   Report Status 10/27/2014 FINAL  Final  Culture, respiratory (NON-Expectorated)     Status: None (Preliminary result)   Collection Time: 10/27/14  8:13 PM  Result Value Ref Range Status   Specimen Description SPUTUM  Final   Special Requests NONE  Final   Gram Stain PENDING  Incomplete   Culture   Final    Culture reincubated for better growth Performed at [facility]    Report Status PENDING  Incomplete     Labs: Basic Metabolic Panel:  Recent Labs Lab 10/27/14 0056 10/27/14 0840 10/27/14 0842 10/28/14 0302 10/29/14 0428  NA 140 138  --  135 144  K 3.6 3.2*  --  3.5 3.7  CL 108 104  --  104 108  CO2 24 25  --  24 27  GLUCOSE 208* 243*  --  291* 182*  BUN 15 15  --  25* 32*  CREATININE 1.24 1.23  --  1.42* 1.40*  CALCIUM 8.7 8.9  --  8.8 9.1  MG  --   --  1.9 2.0  --    Liver Function Tests:  Recent Labs Lab 10/27/14 0840  AST 29  ALT 21  ALKPHOS 95  BILITOT 0.9  PROT 8.3  ALBUMIN 3.9   CBC:  Recent Labs Lab 10/27/14 0056 10/27/14 0840 10/28/14 0302 10/29/14 0428  WBC 15.1* 13.5* 17.0* 16.6*  NEUTROABS  --  12.7*  --   --   HGB 9.5* 9.8* 9.1* 9.1*  HCT 31.0* 32.5* 29.4* 30.1*  MCV 87.8 87.1 86.2 86.7  PLT 255 240 200 210   Cardiac Enzymes:  Recent Labs Lab 10/27/14 0840 10/27/14 1435 10/27/14 2029    TROPONINI 0.13* 0.11* 0.08*   CBG:  Recent Labs Lab 10/28/14 0751 10/28/14 1136 10/28/14 1713 10/28/14 2102 10/29/14 0728  GLUCAP 317* 258* 245* 291* 142*     IMAGING STUDIES Dg Chest 2 View (if Patient Has Fever And/or Copd)  10/27/2014   CLINICAL DATA:  Acute onset of shortness of breath and weakness. Cough. Initial encounter.  EXAM: CHEST  2 VIEW  COMPARISON:  None.  FINDINGS: The lungs are well-aerated. Vascular congestion is noted, with increased interstitial markings, concerning for mild pulmonary edema. There is no evidence of pleural effusion or pneumothorax.  The heart is mildly enlarged. No acute osseous abnormalities are seen.  IMPRESSION: Vascular congestion and mild cardiomegaly, with increased interstitial markings, concerning for mild pulmonary edema.   Electronically Signed   By: Garald Balding M.D.   On: 10/27/2014 01:12    DISCHARGE EXAMINATION: Filed Vitals:   10/29/14 0601 10/29/14 0828 10/29/14 0930 10/29/14 1011  BP: 137/73  134/56 134/56  Pulse: 55  68   Temp: 97.5 F (36.4 C)  98.7 F (37.1 C)   TempSrc: Oral  Oral   Resp: 20  20  Height:      Weight: 133.267 kg (293 lb 12.8 oz)     SpO2: 99% 97% 98%    General appearance: alert, cooperative, appears stated age and no distress Resp: clear to auscultation bilaterally Cardio: regular rate and rhythm, S1, S2 normal, no murmur, click, rub or gallop GI: soft, non-tender; bowel sounds normal; no masses,  no organomegaly  DISPOSITION: Home  Discharge Instructions    Call MD for:  difficulty breathing, headache or visual disturbances    Complete by:  As directed      Call MD for:  extreme fatigue    Complete by:  As directed      Call MD for:  persistant dizziness or light-headedness    Complete by:  As directed      Call MD for:  persistant nausea and vomiting    Complete by:  As directed      Call MD for:  temperature >100.4    Complete by:  As directed      Diet - low sodium heart healthy     Complete by:  As directed      Diet Carb Modified    Complete by:  As directed      Discharge instructions    Complete by:  As directed   Cardiologist will arrange stress test as outpatient. Please get blood work Artist) done on 1/26 when you follow up with your providers at [facility] to check your kidney function.     Increase activity slowly    Complete by:  As directed            ALLERGIES:  Allergies  Allergen Reactions  . Sulfa Antibiotics   . Penicillins Rash     Discharge Medication List as of 10/29/2014 10:06 AM    START taking these medications   Details  cefpodoxime (VANTIN) 200 MG tablet Take 1 tablet (200 mg total) by mouth every 12 (twelve) hours. For 4 more days., Starting 10/29/2014, Until Discontinued, Print    predniSONE (DELTASONE) 20 MG tablet Take 3 tablets once daily for 2 days, then take 2 tablets once daily for 3 days, then take 1 tablet once daily for 3 days and then stop., Print      CONTINUE these medications which have CHANGED   Details  furosemide (LASIX) 20 MG tablet Take 2 tablets (40 mg total) by mouth daily., Starting 10/29/2014, Until Discontinued, Print      CONTINUE these medications which have NOT CHANGED   Details  albuterol (PROVENTIL HFA;VENTOLIN HFA) 108 (90 BASE) MCG/ACT inhaler Inhale into the lungs every 6 (six) hours as needed for wheezing or shortness of breath., Until Discontinued, Historical Med    amLODipine (NORVASC) 10 MG tablet Take 10 mg by mouth daily., Until Discontinued, Historical Med    aspirin EC 81 MG tablet Take 81 mg by mouth daily., Until Discontinued, Historical Med    glipiZIDE (GLUCOTROL) 10 MG tablet Take 20 mg by mouth 2 (two) times daily before a meal., Until Discontinued, Historical Med    hydrALAZINE (APRESOLINE) 100 MG tablet Take 100 mg by mouth 3 (three) times daily., Until Discontinued, Historical Med    insulin glargine (LANTUS) 100 UNIT/ML injection Inject 40 Units into the skin 2 (two) times daily., Until  Discontinued, Historical Med    lisinopril (PRINIVIL,ZESTRIL) 40 MG tablet Take 40 mg by mouth daily., Until Discontinued, Historical Med    metoprolol (LOPRESSOR) 50 MG tablet Take 100 mg by mouth 2 (two) times daily.,  Until Discontinued, Historical Med    Multiple Vitamin (MULTIVITAMIN WITH MINERALS) TABS tablet Take 1 tablet by mouth daily., Until Discontinued, Historical Med    potassium chloride SA (K-DUR,KLOR-CON) 20 MEQ tablet Take 20 mEq by mouth daily., Until Discontinued, Historical Med    rosuvastatin (CRESTOR) 10 MG tablet Take 10 mg by mouth daily., Until Discontinued, Historical Med    sertraline (ZOLOFT) 100 MG tablet Take 100 mg by mouth daily., Until Discontinued, Historical Med      STOP taking these medications     potassium chloride (K-DUR) 10 MEQ tablet          TOTAL DISCHARGE TIME: 35 minutes  [facility] Pager [phone]  10/29/2014, 12:13 PM

## 2014-10-30 LAB — CULTURE, RESPIRATORY W GRAM STAIN: Culture: NORMAL

## 2014-10-30 LAB — CULTURE, RESPIRATORY: Gram Stain: NONE SEEN

## 2017-03-10 ENCOUNTER — Ambulatory Visit: Payer: No Typology Code available for payment source | Attending: Internal Medicine | Admitting: Physical Therapy

## 2017-03-10 ENCOUNTER — Encounter: Payer: Self-pay | Admitting: Physical Therapy

## 2017-03-10 DIAGNOSIS — M25562 Pain in left knee: Secondary | ICD-10-CM | POA: Diagnosis not present

## 2017-03-10 DIAGNOSIS — R262 Difficulty in walking, not elsewhere classified: Secondary | ICD-10-CM | POA: Diagnosis present

## 2017-03-10 DIAGNOSIS — M25561 Pain in right knee: Secondary | ICD-10-CM | POA: Insufficient documentation

## 2017-03-10 DIAGNOSIS — M6281 Muscle weakness (generalized): Secondary | ICD-10-CM | POA: Diagnosis present

## 2017-03-10 DIAGNOSIS — G8929 Other chronic pain: Secondary | ICD-10-CM | POA: Insufficient documentation

## 2017-03-10 NOTE — Patient Instructions (Signed)
Short Arc [employer] a large can or rolled towel under leg. Straighten knee and leg. Hold __5__ seconds. Repeat with other leg. Repeat __10__ times. Do __1__ sessions per day.  http://gt2.exer.us/365   Copyright  VHI. All rights reserved.   Hip Flexion / Knee Extension: Straight-Leg Raise (Eccentric)    Lie on back. Lift leg with knee straight. Slowly lower leg for 3-5 seconds. __10_ reps per set, _2__ sets per day, __5_ days per week. Lower like elevator, stopping at each floor.   Copyright  VHI. All rights reserved.

## 2017-03-10 NOTE — Therapy (Addendum)
[facility] [address] [place], [place], [address] Phone: [phone]   Fax:  [phone]  Physical Therapy Evaluation  Patient Details  Name: Derek Ibarra MRN: [id number] Date of Birth: Jan 25, 1956 Referring Provider: Jennette Kettle   Encounter Date: 03/10/2017      PT End of Session - 03/10/17 1603    Visit Number 1   Number of Visits 14   Date for PT Re-Evaluation 06/02/17   Authorization Type VA   Authorization - Visit Number 1   Authorization - Number of Visits 14   PT Start Time 3016   PT Stop Time 0109   PT Time Calculation (min) 44 min   Activity Tolerance Patient tolerated treatment well   Behavior During Therapy [facility] for tasks assessed/performed      Past Medical History:  Diagnosis Date  . G6PD deficiency ([place])   . Hypertension   . Hypertensive heart disease   . Morbid obesity ([place])   . OSA on CPAP    16 CM pressure  . Sleep apnea     Past Surgical History:  Procedure Laterality Date  . KNEE SURGERY      There were no vitals filed for this visit.       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 Heat and NSAIDs have been helping.   Limitations --  going up stairs   How long can you walk comfortably? 1/4 mile   Patient Stated Goals walk better   Currently in Pain? Yes   Pain Score 6   average   Pain Location Knee   Pain Orientation Right;Left   Pain Descriptors / Indicators Sore;Nagging   Pain Type Chronic pain   Pain Onset More than a month ago   Pain Frequency Intermittent   Aggravating Factors  driving long distance, sitting long time, walking, up stairs   Pain Relieving Factors heat and NSAIDs   Effect of Pain on Daily Activities walking   Multiple Pain Sites No            [facility] PT Assessment - 03/10/17 0001      Assessment   Medical Diagnosis  M25.569 (ICD-10-CM) - Pain in unspecified knee   Referring Provider CALIFANO, SOPHIA    Prior Therapy no     Precautions   Precautions None     Restrictions   Weight Bearing Restrictions No     Balance Screen   Has the patient fallen in the past 6 months Yes   How many times? 2   Has the patient had a decrease in activity level because of a fear of falling?  No   Is the patient reluctant to leave their home because of a fear of falling?  No     Home Environment   Living Environment Private residence   Living Arrangements Alone   Type of [place]     Prior Function   Level of Independence Independent   Vocation Unemployed     Cognition   Overall Cognitive Status Within Functional Limits for tasks assessed     Observation/Other Assessments   Observations obese   Focus on Therapeutic Outcomes (FOTO)  32% limited     Posture/Postural Control   Posture/Postural Control Postural limitations   Postural Limitations Rounded Shoulders     ROM / Strength   AROM / PROM / Strength  AROM;Strength     Strength   Strength Assessment Site Hip;Knee   Right/Left Hip Right;Left   Right Hip External Rotation  4-/5   Right Hip Internal Rotation 4-/5   Right Hip ADduction 4-/5   Left Hip External Rotation 4-/5   Left Hip Internal Rotation 4-/5   Left Hip ADduction 4-/5   Right/Left Knee Right;Left   Right Knee Extension 4-/5   Left Knee Extension 4-/5     Palpation   Patella mobility WNL   Palpation comment IT band tight     Ambulation/Gait   Gait Pattern --  wide base of support            Objective measurements completed on examination: See above findings.          [facility] Adult PT Treatment/Exercise - 03/10/17 0001      Exercises   Exercises Knee/Hip     Knee/Hip Exercises: Supine   Short Arc Quad Sets Strengthening;Right;Left;10 reps  5 sec hold   Straight Leg Raises Strengthening;Both;10 reps     Modalities   Modalities Electrical  Stimulation;Cryotherapy     Cryotherapy   Number Minutes Cryotherapy 15 Minutes   Cryotherapy Location Knee  left   Type of Cryotherapy Ice pack     Electrical Stimulation   Electrical Stimulation Location left knee   Electrical Stimulation Action IFC   Electrical Stimulation Parameters 15 min to tolerance (31)   Electrical Stimulation Goals Pain                PT Education - 03/10/17 1603    Education provided Yes   Education Details SAQ, SLR   Person(s) Educated Patient   Methods Explanation;Demonstration;Tactile cues;Verbal cues;Handout;Other (comment)   Comprehension Verbalized understanding;Returned demonstration          PT Short Term Goals - 03/10/17 1639      PT SHORT TERM GOAL #1   Title independent with initial HEP issued 5/30   Time 4   Period Weeks   Status New     PT SHORT TERM GOAL #2   Title pt will report 25% reduced pain throughout the day   Time 4   Period Weeks   Status New     PT SHORT TERM GOAL #3   Title pt will be able to walk 1/2 mile due to increased strength and endurance   Time 4   Period Weeks   Status New           PT Long Term Goals - 03/10/17 1642      PT LONG TERM GOAL #1   Title Pt will report 50% reduction of symptoms   Time 8   Period Weeks   Status New     PT LONG TERM GOAL #2   Title pt will be able to perform step up onto 6 inch step 10x without increased pain in order to work on funtional LE strength   Time 8   Period Weeks   Status New     PT LONG TERM GOAL #3   Title MMT knee extension 5/5 bilaterally for improved transfers   Time 8   Period Weeks   Status New     PT LONG TERM GOAL #4   Title pt will be able to tolerate 30 minute walk 5 days/week for transition to exercise program   Time 8   Period Weeks   Status New     PT LONG TERM GOAL #5   Title pt  independent with advanced HEP   Time 8   Period Weeks   Status New                Plan - 03/10/17 1630    Clinical  Impression Statement Patient presents with bilateral knee pain that is chronic.  Patient has slow and wide gait due to weakness and excess body mass.  Swelling of knees comes and goes.  Pt has normal AROM.  He has weakness in bilateral knees and hips.  Tightness in bilateral IT bands.  Pt had no change of symtpms with patellar repositionsing.  He currently has difficulty walking >1/4 mile and going up steps due to increased pain.  Pt will benefit from skilled PT for improved strength and stability in order to perform functional tasks.   History and Personal Factors relevant to plan of care: obesity, chronic pain, chondromalacia knee surgery   Clinical Presentation Stable   Clinical Presentation due to: knee pain, hip and knee weakness, difficulty walking   Clinical Decision Making Low   Rehab Potential Good   Clinical Impairments Affecting Rehab Potential obesity, chronic pain, chondromalacia knee surgery   PT Frequency 2x / week   PT Duration 8 weeks   PT Treatment/Interventions ADLs/Self Care Home Management;Biofeedback;Cryotherapy;Electrical Stimulation;Moist Heat;Ultrasound;Gait training;Stair training;Functional mobility training;Therapeutic activities;Therapeutic exercise;Balance training;Neuromuscular re-education;Patient/family education;Dry needling;Taping;Manual techniques   PT Next Visit Plan f/u with response to stim and ice, progress strengthening as tolerated   Recommended Other Services n/a   Consulted and Agree with Plan of Care Patient      Patient will benefit from skilled therapeutic intervention in order to improve the following deficits and impairments:  Abnormal gait, Difficulty walking, Decreased strength, Pain, Obesity, Increased fascial restricitons, Increased edema  Visit Diagnosis: Chronic pain of left knee  Chronic pain of right knee  Muscle weakness (generalized)  Difficulty in walking, not elsewhere classified  Gcodes: Mobility walking - based on FOTO and  clinical assessment Current status CJ Goal status CJ   Problem List Patient Active Problem List   Diagnosis Date Noted  . Chronic diastolic heart failure ([place]) 10/29/2014  . Pulmonary edema 10/27/2014  . G6PD deficiency ([place]) 10/27/2014  . Acute respiratory failure with hypoxemia ([place]) 10/27/2014  . Hypertensive heart disease 10/27/2014  . Smoker 10/27/2014  . Elevated troponin 10/27/2014  . Morbid obesity ([place]) 10/27/2014  . 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 Sleep apnea 10/27/2014    Zannie Cove, PT 03/10/2017, 4:51 PM  [facility] [address], [place], [place], [address] Phone: [phone]   Fax:  [phone]  Name: Niall Illes MRN: [id number] Date of Birth: [birth year]

## 2017-03-16 ENCOUNTER — Ambulatory Visit: Payer: No Typology Code available for payment source | Attending: Internal Medicine | Admitting: Physical Therapy

## 2017-03-16 DIAGNOSIS — M25561 Pain in right knee: Secondary | ICD-10-CM | POA: Diagnosis present

## 2017-03-16 DIAGNOSIS — R262 Difficulty in walking, not elsewhere classified: Secondary | ICD-10-CM | POA: Diagnosis present

## 2017-03-16 DIAGNOSIS — M6281 Muscle weakness (generalized): Secondary | ICD-10-CM | POA: Insufficient documentation

## 2017-03-16 DIAGNOSIS — M25562 Pain in left knee: Secondary | ICD-10-CM | POA: Diagnosis present

## 2017-03-16 DIAGNOSIS — G8929 Other chronic pain: Secondary | ICD-10-CM | POA: Diagnosis present

## 2017-03-16 NOTE — Therapy (Signed)
[facility] [address] [place], [place], [address] Phone: [phone]   Fax:  [phone]  Physical Therapy Treatment  Patient Details  Name: Derek Ibarra MRN: [id number] Date of Birth: [birth year] Referring Provider: Jennette Kettle   Encounter Date: 03/16/2017      PT End of Session - 03/16/17 0921    Visit Number 2   Number of Visits 14   Date for PT Re-Evaluation 06/02/17   Authorization Type VA   Authorization - Visit Number 2   Authorization - Number of Visits 14   PT Start Time 0807   PT Stop Time 0903   PT Time Calculation (min) 56 min   Activity Tolerance Patient tolerated treatment well      Past Medical History:  Diagnosis Date  . G6PD deficiency ([place])   . Hypertension   . Hypertensive heart disease   . Morbid obesity ([place])   . OSA on CPAP    16 CM pressure  . Sleep apnea     Past Surgical History:  Procedure Laterality Date  . KNEE SURGERY      There were no vitals filed for this visit.      Subjective Assessment - 03/16/17 0811    Subjective My knees pop like popcorn.  Right > left knee pain.  Mostly lateral knee pain on right, medial knee pain on left.     Currently in Pain? Yes   Pain Score 5    Pain Location Knee   Pain Orientation Right   Pain Type Chronic pain                         [facility] Adult PT Treatment/Exercise - 03/16/17 0001      Therapeutic Activites    Therapeutic Activities Other Therapeutic Activities   Other Therapeutic Activities for standing, walking, sit to stand, stairs     Neuro Re-ed    Neuro Re-ed Details  quad and gluteal activation     Knee/Hip Exercises: Stretches   Active Hamstring Stretch Right;Left;Both;3 reps;30 seconds  seated with stool   Quad Stretch --  on steps     Knee/Hip Exercises: Aerobic   Nustep 10 min level 1 seat 11     Knee/Hip Exercises: Seated   Long Arc Quad Strengthening;Right;Left;10 reps   Long Arc  Quad Limitations red  band   Clamshell with TheraBand Red  3x10   Knee/Hip Flexion 10x right/left red band     Knee/Hip Exercises: Supine   Straight Leg Raises Strengthening;Both;10 reps     Knee/Hip Exercises: Sidelying   Hip ABduction AROM;Strengthening;Right;Left;10 reps     Knee/Hip Exercises: Prone   Hip Extension AROM;Strengthening;Right;Left;10 reps     Cryotherapy   Number Minutes Cryotherapy 15 Minutes   Cryotherapy Location Knee  left   Type of Cryotherapy Ice pack     Electrical Stimulation   Electrical Stimulation Location left and right knee   Electrical Stimulation Action pre-mod   Electrical Stimulation Parameters 38 ma 15 min   Electrical Stimulation Goals Pain                  PT Short Term Goals - 03/16/17 1707      PT SHORT TERM GOAL #1   Title independent with initial HEP issued 5/30   Time 4   Period Weeks   Status On-going     PT SHORT TERM GOAL #2   Title pt will report  25% reduced pain throughout the day   Time 4   Period Weeks   Status On-going     PT SHORT TERM GOAL #3   Title pt will be able to walk 1/2 mile due to increased strength and endurance   Time 4   Period Weeks   Status On-going           PT Long Term Goals - 03/16/17 1707      PT LONG TERM GOAL #1   Title Pt will report 50% reduction of symptoms   Time 8   Period Weeks   Status On-going     PT LONG TERM GOAL #2   Title pt will be able to perform step up onto 6 inch step 10x without increased pain in order to work on funtional LE strength   Time 8   Period Weeks   Status On-going     PT LONG TERM GOAL #3   Title MMT knee extension 5/5 bilaterally for improved transfers   Time 8   Period Weeks   Status On-going     PT LONG TERM GOAL #4   Title pt will be able to tolerate 30 minute walk 5 days/week for transition to exercise program   Period Weeks   Status On-going     PT LONG TERM GOAL #5   Title pt independent with advanced HEP   Time 8    Period Weeks   Status On-going               Plan - 03/16/17 0388    Clinical Impression Statement Able to perform open chain exercises and lightly resisted exercises with minimal pain in his knees.   Decreased HS length bilaterally.  Mild knee joint swelling.  Pain and swelling decreased following electrical stimulation and cold.     PT Frequency 2x / week   PT Duration 8 weeks   PT Treatment/Interventions ADLs/Self Care Home Management;Biofeedback;Cryotherapy;Electrical Stimulation;Moist Heat;Ultrasound;Gait training;Stair training;Functional mobility training;Therapeutic activities;Therapeutic exercise;Balance training;Neuromuscular re-education;Patient/family education;Dry needling;Taping;Manual techniques   PT Next Visit Plan open chain and low resistance LE strengthening;  continue Nu-Step;  electrical stim and ice for pain control      Patient will benefit from skilled therapeutic intervention in order to improve the following deficits and impairments:  Abnormal gait, Difficulty walking, Decreased strength, Pain, Obesity, Increased fascial restricitons, Increased edema  Visit Diagnosis: Chronic pain of left knee  Chronic pain of right knee  Muscle weakness (generalized)  Difficulty in walking, not elsewhere classified     Problem List Patient Active Problem List   Diagnosis Date Noted  . Chronic diastolic heart failure ([place]) 10/29/2014  . Pulmonary edema 10/27/2014  . G6PD deficiency ([place]) 10/27/2014  . Acute respiratory failure with hypoxemia ([place]) 10/27/2014  . Hypertensive heart disease 10/27/2014  . Smoker 10/27/2014  . Elevated troponin 10/27/2014  . Morbid obesity ([place]) 10/27/2014  . 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 Sleep apnea 10/27/2014   Ruben Im, PT 03/16/17 5:10 PM Phone: [phone] Fax: [phone]  Alvera Singh 03/16/2017, 5:10 PM  [facility] [address], [place], [place], [address] Phone: [phone]   Fax:  [phone]  Name: Derek Ibarra MRN: [id number] Date of Birth: 1956/01/10

## 2017-03-17 ENCOUNTER — Ambulatory Visit: Payer: No Typology Code available for payment source

## 2017-03-17 ENCOUNTER — Telehealth: Payer: Self-pay

## 2017-03-17 NOTE — Telephone Encounter (Signed)
Pt missed scheduled visit today so called and left message to ask [place] to return our call.

## 2017-03-23 ENCOUNTER — Ambulatory Visit: Payer: No Typology Code available for payment source | Admitting: Physical Therapy

## 2017-03-23 DIAGNOSIS — M25562 Pain in left knee: Principal | ICD-10-CM

## 2017-03-23 DIAGNOSIS — M25561 Pain in right knee: Secondary | ICD-10-CM

## 2017-03-23 DIAGNOSIS — G8929 Other chronic pain: Secondary | ICD-10-CM

## 2017-03-23 DIAGNOSIS — R262 Difficulty in walking, not elsewhere classified: Secondary | ICD-10-CM

## 2017-03-23 DIAGNOSIS — M6281 Muscle weakness (generalized): Secondary | ICD-10-CM

## 2017-03-23 NOTE — Therapy (Signed)
[facility] [address], [place], [place], [address] Phone: [phone]   Fax:  [phone]  Physical Therapy Treatment  Patient Details  Name: Derek Ibarra MRN: [id number] Date of Birth: 1956/09/20 Referring Provider: Jennette Kettle   Encounter Date: 03/23/2017      PT End of Session - 03/23/17 1222    Visit Number 3   Number of Visits 14   Date for PT Re-Evaluation 06/02/17   Authorization Type VA   Authorization - Visit Number 3   Authorization - Number of Visits 14   PT Start Time 1146   PT Stop Time 1221   PT Time Calculation (min) 35 min   Activity Tolerance Treatment limited secondary to medical complications (Comment)  pt nauseous-requested to stop   Behavior During Therapy [facility] for tasks assessed/performed      Past Medical History:  Diagnosis Date  . G6PD deficiency ([place])   . Hypertension   . Hypertensive heart disease   . Morbid obesity ([place])   . OSA on CPAP    16 CM pressure  . Sleep apnea     Past Surgical History:  Procedure Laterality Date  . KNEE SURGERY      There were no vitals filed for this visit.      Subjective Assessment - 03/23/17 1149    Subjective had an MD appt in [place] last week which is why he missed appointment.  knees are feeling a little better.   How long can you walk comfortably? 1/2 mile   Patient Stated Goals walk better   Currently in Pain? Yes   Pain Score 5    Pain Location Knee   Pain Orientation Right;Left   Pain Descriptors / Indicators Dull;Nagging   Pain Type Chronic pain   Pain Onset More than a month ago   Pain Frequency Intermittent   Aggravating Factors  driving long distances; prolonged sitting, walking, stairs   Pain Relieving Factors heat, NSAIDs                         [facility] Adult PT Treatment/Exercise - 03/23/17 1151      Knee/Hip Exercises: Stretches   Active Hamstring Stretch Right;Left;Both;3 reps;30 seconds   seated with stool     Knee/Hip Exercises: Aerobic   Recumbent Bike L2 x 8 min     Knee/Hip Exercises: Seated   Long Arc Quad Strengthening;Right;Left;10 reps;2 sets   [employer] Weight 2 lbs.   Long [employer] Limitations with ball squeeze   Hamstring Curl Both;2 sets;10 reps   Hamstring Limitations green theraband   Sit to [employer] --  attempted; pt became nauseous so stopped     Knee/Hip Exercises: Supine   Straight Leg Raise with External Rotation Both;2 sets;10 reps   Straight Leg Raise with External Rotation Limitations 2#     Knee/Hip Exercises: Sidelying   Hip ABduction Both;2 sets;10 reps   Hip ABduction Limitations 2#; mod cues for proper technique                  PT Short Term Goals - 03/16/17 1707      PT SHORT TERM GOAL #1   Title independent with initial HEP issued 5/30   Time 4   Period Weeks   Status On-going     PT SHORT TERM GOAL #2   Title pt will report 25% reduced pain throughout the day   Time 4  Period Weeks   Status On-going     PT SHORT TERM GOAL #3   Title pt will be able to walk 1/2 mile due to increased strength and endurance   Time 4   Period Weeks   Status On-going           PT Long Term Goals - 03/16/17 1707      PT LONG TERM GOAL #1   Title Pt will report 50% reduction of symptoms   Time 8   Period Weeks   Status On-going     PT LONG TERM GOAL #2   Title pt will be able to perform step up onto 6 inch step 10x without increased pain in order to work on funtional LE strength   Time 8   Period Weeks   Status On-going     PT LONG TERM GOAL #3   Title MMT knee extension 5/5 bilaterally for improved transfers   Time 8   Period Weeks   Status On-going     PT LONG TERM GOAL #4   Title pt will be able to tolerate 30 minute walk 5 days/week for transition to exercise program   Period Weeks   Status On-going     PT LONG TERM GOAL #5   Title pt independent with advanced HEP   Time 8   Period Weeks   Status  On-going               Plan - 03/23/17 1222    Clinical Impression Statement Pt tolerated strengthening open chain exercises well today needing min to mod cues for proper technique.  Attempted sit to stand and after 2 reps pt because nauseous.  Provided water and symptoms improved after 3 minutes.  Pt then requested to end session.  Declined assessing vitals and stated "I'll be okay."  Assisted pt out of clinic safely.     PT Next Visit Plan open chain and low resistance LE strengthening;  continue Nu-Step;  electrical stim and ice for pain control   Consulted and Agree with Plan of Care Patient      Patient will benefit from skilled therapeutic intervention in order to improve the following deficits and impairments:  Abnormal gait, Difficulty walking, Decreased strength, Pain, Obesity, Increased fascial restricitons, Increased edema  Visit Diagnosis: Chronic pain of left knee  Chronic pain of right knee  Muscle weakness (generalized)  Difficulty in walking, not elsewhere classified     Problem List Patient Active Problem List   Diagnosis Date Noted  . Chronic diastolic heart failure ([place]) 10/29/2014  . Pulmonary edema 10/27/2014  . G6PD deficiency ([place]) 10/27/2014  . Acute respiratory failure with hypoxemia ([place]) 10/27/2014  . Hypertensive heart disease 10/27/2014  . Smoker 10/27/2014  . Elevated troponin 10/27/2014  . Morbid obesity ([place]) 10/27/2014  . 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 Sleep apnea 10/27/2014      Laureen Abrahams, PT, DPT 03/23/17 12:25 PM    [facility] [address], [place], [place], [address] Phone: [phone]   Fax:  [phone]  Name: Derek Ibarra MRN: [id number] Date of Birth: Feb 04, 1956

## 2017-03-25 ENCOUNTER — Encounter: Payer: Non-veteran care | Admitting: Physical Therapy

## 2017-04-01 ENCOUNTER — Ambulatory Visit: Payer: No Typology Code available for payment source | Admitting: Physical Therapy

## 2017-04-06 ENCOUNTER — Ambulatory Visit: Payer: No Typology Code available for payment source | Admitting: Physical Therapy

## 2017-04-06 ENCOUNTER — Telehealth: Payer: Self-pay | Admitting: Physical Therapy

## 2017-04-08 ENCOUNTER — Ambulatory Visit: Payer: No Typology Code available for payment source | Admitting: Physical Therapy

## 2017-04-08 DIAGNOSIS — R262 Difficulty in walking, not elsewhere classified: Secondary | ICD-10-CM

## 2017-04-08 DIAGNOSIS — M25562 Pain in left knee: Secondary | ICD-10-CM | POA: Diagnosis not present

## 2017-04-08 DIAGNOSIS — G8929 Other chronic pain: Secondary | ICD-10-CM

## 2017-04-08 DIAGNOSIS — M25561 Pain in right knee: Secondary | ICD-10-CM

## 2017-04-08 DIAGNOSIS — M6281 Muscle weakness (generalized): Secondary | ICD-10-CM

## 2017-04-08 NOTE — Therapy (Signed)
[facility] [address], [place], [place], [address] Phone: [phone]   Fax:  [phone]  Physical Therapy Treatment  Patient Details  Name: Derek Ibarra MRN: [id number] Date of Birth: Feb 28, 1956 Referring Provider: Jennette Kettle   Encounter Date: 04/08/2017      PT End of Session - 04/08/17 1243    Visit Number 4   Number of Visits 14   Date for PT Re-Evaluation 06/02/17   Authorization Type VA   Authorization - Visit Number 4   Authorization - Number of Visits 14   PT Start Time 1230   PT Stop Time 1300   PT Time Calculation (min) 30 min   Activity Tolerance Patient tolerated treatment well      Past Medical History:  Diagnosis Date  . G6PD deficiency ([place])   . Hypertension   . Hypertensive heart disease   . Morbid obesity ([place])   . OSA on CPAP    16 CM pressure  . Sleep apnea     Past Surgical History:  Procedure Laterality Date  . KNEE SURGERY      There were no vitals filed for this visit.      Subjective Assessment - 04/08/17 1239    Subjective Patient reports 10/10 bilateral knee pain upon arrival and states, "you should just sent me home."  He states he had to go see the cardiologist this morning and their elevator was out so he had to climb 2 flights of stairs twice (after realizing he left some paperwork in the car.)  He declines doing Nu-Step today.   Currently in Pain? Yes   Pain Score 10-Worst pain ever   Pain Location Knee   Pain Orientation Right;Left   Pain Type Chronic pain   Pain Onset More than a month ago   Pain Frequency Intermittent   Aggravating Factors  stairs                         [facility] Adult PT Treatment/Exercise - 04/08/17 0001      Knee/Hip Exercises: Aerobic   Recumbent Bike patient declines     Knee/Hip Exercises: Supine   Other Supine Knee/Hip Exercises ankle pumps 10x   Other Supine Knee/Hip Exercises quad sets 10x     Cryotherapy    Number Minutes Cryotherapy 20 Minutes   Cryotherapy Location Knee  bil   Type of Cryotherapy Ice pack     Electrical Stimulation   Electrical Stimulation Location bilateral knees   lateral and anterior    Electrical Stimulation Action pre-mod   Electrical Stimulation Parameters 40 ma 20 min   Electrical Stimulation Goals Pain     Manual Therapy   Manual Therapy Taping   McConnell bil lateral glide correction and fat pad release                  PT Short Term Goals - 04/08/17 1354      PT SHORT TERM GOAL #1   Title independent with initial HEP issued 5/30   Time 4   Period Weeks   Status On-going     PT SHORT TERM GOAL #2   Title pt will report 25% reduced pain throughout the day   Time 4   Period Weeks   Status On-going     PT SHORT TERM GOAL #3   Title pt will be able to walk 1/2 mile due to increased strength and endurance   Time  4   Period Weeks   Status On-going           PT Long Term Goals - 04/08/17 1355      PT LONG TERM GOAL #1   Title Pt will report 50% reduction of symptoms   Time 8   Period Weeks   Status On-going     PT LONG TERM GOAL #2   Title pt will be able to perform step up onto 6 inch step 10x without increased pain in order to work on funtional LE strength   Time 8   Period Weeks   Status On-going     PT LONG TERM GOAL #3   Title MMT knee extension 5/5 bilaterally for improved transfers   Time 8   Period Weeks   Status On-going     PT LONG TERM GOAL #4   Title pt will be able to tolerate 30 minute walk 5 days/week for transition to exercise program   Time 8   Period Weeks   Status On-going     PT LONG TERM GOAL #5   Title pt independent with advanced HEP   Time 8   Period Weeks   Status On-going               Plan - 04/08/17 1352    Clinical Impression Statement The patient returns after 2 missed appointments.  He complains of severe 10/10 pain after doing several flights of stairs this morning.    Treatment focus on pain control.  Following modalities and McConnell taping he states he feels better than when he came in.     Rehab Potential Good   Clinical Impairments Affecting Rehab Potential obesity, chronic pain, chondromalacia knee surgery   PT Frequency 2x / week   PT Duration 8 weeks   PT Treatment/Interventions ADLs/Self Care Home Management;Biofeedback;Cryotherapy;Electrical Stimulation;Moist Heat;Ultrasound;Gait training;Stair training;Functional mobility training;Therapeutic activities;Therapeutic exercise;Balance training;Neuromuscular re-education;Patient/family education;Dry needling;Taping;Manual techniques   PT Next Visit Plan assess response to McConnell taping;  open chain and low resistance LE strengthening;  resume Nu-Step;  electrical stim and ice for pain control;  check progress toward goals      Patient will benefit from skilled therapeutic intervention in order to improve the following deficits and impairments:  Abnormal gait, Difficulty walking, Decreased strength, Pain, Obesity, Increased fascial restricitons, Increased edema  Visit Diagnosis: Chronic pain of left knee  Chronic pain of right knee  Muscle weakness (generalized)  Difficulty in walking, not elsewhere classified     Problem List Patient Active Problem List   Diagnosis Date Noted  . Chronic diastolic heart failure ([place]) 10/29/2014  . Pulmonary edema 10/27/2014  . G6PD deficiency ([place]) 10/27/2014  . Acute respiratory failure with hypoxemia ([place]) 10/27/2014  . Hypertensive heart disease 10/27/2014  . Smoker 10/27/2014  . Elevated troponin 10/27/2014  . Morbid obesity ([place]) 10/27/2014  . 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 Sleep apnea 10/27/2014   Ruben Im, PT 04/08/17 1:58 PM Phone: [phone] Fax: [phone]  Alvera Singh 04/08/2017, 1:58 PM  [facility] [address], [place], [place], [address] Phone: [phone]   Fax:  [phone]  Name: Derek Ibarra MRN: [id number] Date of Birth: 01/26/1956

## 2017-04-13 ENCOUNTER — Ambulatory Visit: Payer: No Typology Code available for payment source | Attending: Internal Medicine

## 2017-04-13 DIAGNOSIS — M25562 Pain in left knee: Secondary | ICD-10-CM | POA: Insufficient documentation

## 2017-04-13 DIAGNOSIS — R262 Difficulty in walking, not elsewhere classified: Secondary | ICD-10-CM | POA: Insufficient documentation

## 2017-04-13 DIAGNOSIS — M25561 Pain in right knee: Secondary | ICD-10-CM | POA: Insufficient documentation

## 2017-04-13 DIAGNOSIS — M6281 Muscle weakness (generalized): Secondary | ICD-10-CM | POA: Insufficient documentation

## 2017-04-13 DIAGNOSIS — G8929 Other chronic pain: Secondary | ICD-10-CM | POA: Insufficient documentation

## 2017-04-15 ENCOUNTER — Ambulatory Visit: Payer: No Typology Code available for payment source | Admitting: Physical Therapy

## 2017-04-15 ENCOUNTER — Telehealth: Payer: Self-pay | Admitting: Physical Therapy

## 2017-04-15 NOTE — Telephone Encounter (Signed)
No show numerous appts.  Left voicemail

## 2017-04-20 ENCOUNTER — Ambulatory Visit: Payer: No Typology Code available for payment source | Admitting: Physical Therapy

## 2017-04-20 DIAGNOSIS — G8929 Other chronic pain: Secondary | ICD-10-CM

## 2017-04-20 DIAGNOSIS — M25561 Pain in right knee: Secondary | ICD-10-CM

## 2017-04-20 DIAGNOSIS — M25562 Pain in left knee: Secondary | ICD-10-CM | POA: Diagnosis not present

## 2017-04-20 DIAGNOSIS — M6281 Muscle weakness (generalized): Secondary | ICD-10-CM

## 2017-04-20 DIAGNOSIS — R262 Difficulty in walking, not elsewhere classified: Secondary | ICD-10-CM | POA: Diagnosis present

## 2017-04-20 NOTE — Therapy (Signed)
[facility] [address], [place], [place], [address] Phone: [phone]   Fax:  [phone]  Physical Therapy Treatment  Patient Details  Name: Derek Ibarra MRN: [id number] Date of Birth: 06-30-1956 Referring Provider: Jennette Kettle   Encounter Date: 04/20/2017      PT End of Session - 04/20/17 0952    Visit Number 5   Number of Visits 14   Date for PT Re-Evaluation 06/02/17   Authorization Type VA   Authorization - Visit Number 5   Authorization - Number of Visits 14   PT Start Time 0933   PT Stop Time 1016   PT Time Calculation (min) 43 min   Activity Tolerance Patient tolerated treatment well      Past Medical History:  Diagnosis Date  . G6PD deficiency ([place])   . Hypertension   . Hypertensive heart disease   . Morbid obesity ([place])   . OSA on CPAP    16 CM pressure  . Sleep apnea     Past Surgical History:  Procedure Laterality Date  . KNEE SURGERY      There were no vitals filed for this visit.      Subjective Assessment - 04/20/17 0934    Subjective I missed last time b/c I got caught up in traffic and couldn't get back in time.  My Left knee is swollen and painful today.  States he bought 2 knee sleeves and they help when I remember to wear them.  Going to [place] at the end of the month.     Currently in Pain? Yes   Pain Score 5    Pain Location Knee   Pain Orientation Left   Pain Type Chronic pain   Aggravating Factors  stairs    Pain Relieving Factors ice, heat, Shiatsu balls;  TENS is helpful, taping helped                         [facility] Adult PT Treatment/Exercise - 04/20/17 0001      Therapeutic Activites    Other Therapeutic Activities for standing, walking, sit to stand, stairs     Neuro Re-ed    Neuro Re-ed Details  quad and gluteal activation     Knee/Hip Exercises: Aerobic   Nustep L1 15 min     Knee/Hip Exercises: Supine   Short Arc Quad Sets  AROM;Right;Left;10 reps   Straight Leg Raises --   Other Supine Knee/Hip Exercises ankle pumps 10x   Other Supine Knee/Hip Exercises quad sets 10x     Cryotherapy   Number Minutes Cryotherapy 20 Minutes   Cryotherapy Location Knee  bil   Type of Cryotherapy Ice pack     Electrical Stimulation   Electrical Stimulation Location bilateral knees   lateral and anterior    Electrical Stimulation Action pre-mod   Electrical Stimulation Parameters 40 ma 20 min   Electrical Stimulation Goals Pain                  PT Short Term Goals - 04/20/17 0956      PT SHORT TERM GOAL #1   Title independent with initial HEP issued 5/30   Status Achieved     PT SHORT TERM GOAL #2   Title pt will report 25% reduced pain throughout the day     PT SHORT TERM GOAL #3   Title pt will be able to walk 1/2 mile due to increased strength  and endurance   Status Achieved           PT Long Term Goals - 04/20/17 0957      PT LONG TERM GOAL #1   Title Pt will report 50% reduction of symptoms   Time 8   Period Weeks   Status On-going     PT LONG TERM GOAL #2   Title pt will be able to perform step up onto 6 inch step 10x without increased pain in order to work on funtional LE strength   Time 8   Period Weeks   Status On-going     PT LONG TERM GOAL #3   Title MMT knee extension 5/5 bilaterally for improved transfers   Time 8   Period Weeks   Status On-going     PT LONG TERM GOAL #4   Title pt will be able to tolerate 30 minute walk 5 days/week for transition to exercise program   Time 8   Period Weeks   Status On-going     PT LONG TERM GOAL #5   Title pt independent with advanced HEP   Time 8   Period Weeks   Status On-going               Plan - 04/20/17 8850    Clinical Impression Statement The patient returns after 2 no-show appointments.  He complains of moderate left knee pain today with visible swelling.  He has been using bilateral knee compression sleeves but  states he doesn't always remember to put them on.  He declines many exercises but agrees to do very low level open chain with electrical stimulation.  Able to walk 1/2 mile fairly comfortably.  Slow to meet rehab goals secondary to attendance but STGS met.     PT Frequency 2x / week   PT Duration 8 weeks   PT Treatment/Interventions ADLs/Self Care Home Management;Biofeedback;Cryotherapy;Electrical Stimulation;Moist Heat;Ultrasound;Gait training;Stair training;Functional mobility training;Therapeutic activities;Therapeutic exercise;Balance training;Neuromuscular re-education;Patient/family education;Dry needling;Taping;Manual techniques   PT Next Visit Plan  open chain and low resistance LE strengthening;  resume Nu-Step;  electrical stim and ice for pain control;  check progress toward goals      Patient will benefit from skilled therapeutic intervention in order to improve the following deficits and impairments:  Abnormal gait, Difficulty walking, Decreased strength, Pain, Obesity, Increased fascial restricitons, Increased edema  Visit Diagnosis: Chronic pain of left knee  Chronic pain of right knee  Muscle weakness (generalized)  Difficulty in walking, not elsewhere classified     Problem List Patient Active Problem List   Diagnosis Date Noted  . Chronic diastolic heart failure ([place]) 10/29/2014  . Pulmonary edema 10/27/2014  . G6PD deficiency ([place]) 10/27/2014  . Acute respiratory failure with hypoxemia ([place]) 10/27/2014  . Hypertensive heart disease 10/27/2014  . Smoker 10/27/2014  . Elevated troponin 10/27/2014  . Morbid obesity ([place]) 10/27/2014  . 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 Sleep apnea 10/27/2014   Ruben Im, PT 04/20/17 10:01 AM Phone: [phone] Fax: [phone]  Alvera Singh 04/20/2017, 10:00 AM  [facility] [address] [place], [place],  [address] Phone: [phone]   Fax:  [phone]  Name: Derek Ibarra MRN: [id number] Date of Birth: [birth year]

## 2017-04-22 ENCOUNTER — Ambulatory Visit: Payer: No Typology Code available for payment source | Admitting: Physical Therapy

## 2017-04-22 DIAGNOSIS — R262 Difficulty in walking, not elsewhere classified: Secondary | ICD-10-CM

## 2017-04-22 DIAGNOSIS — M6281 Muscle weakness (generalized): Secondary | ICD-10-CM

## 2017-04-22 DIAGNOSIS — M25562 Pain in left knee: Secondary | ICD-10-CM | POA: Diagnosis not present

## 2017-04-22 DIAGNOSIS — G8929 Other chronic pain: Secondary | ICD-10-CM

## 2017-04-22 DIAGNOSIS — M25561 Pain in right knee: Secondary | ICD-10-CM

## 2017-04-22 NOTE — Therapy (Addendum)
[facility] [address] [place], [place], [address] Phone: [phone]   Fax:  [phone]  Physical Therapy Treatment/Discharge Summary  Patient Details  Name: Derek Ibarra MRN: [id number] Date of Birth: 09-10-1956 Referring Provider: Jennette Kettle   Encounter Date: 04/22/2017      PT End of Session - 04/22/17 1256    Visit Number 6   Number of Visits 14   Date for PT Re-Evaluation 06/02/17   Authorization Type [facility]   Authorization - Visit Number 6   Authorization - Number of Visits 14   PT Start Time 7106   PT Stop Time 1313   PT Time Calculation (min) 38 min   Activity Tolerance Patient tolerated treatment well      Past Medical History:  Diagnosis Date  . G6PD deficiency ([place])   . Hypertension   . Hypertensive heart disease   . Morbid obesity ([place])   . OSA on CPAP    16 CM pressure  . Sleep apnea     Past Surgical History:  Procedure Laterality Date  . KNEE SURGERY      There were no vitals filed for this visit.      Subjective Assessment - 04/22/17 1239    Subjective Using knee sleeves at times.  Walked 1/2 mile yesterday and that may be why my knees are swollen.     Currently in Pain? Yes   Pain Score 5    Pain Location Knee   Pain Orientation Left   Pain Type Chronic pain                         [facility] Adult PT Treatment/Exercise - 04/22/17 0001      Self-Care   Self-Care Other Self-Care Comments   Other Self-Care Comments  Patient feels home TENS would be helpful, he will discuss at the [place] appt next week and online options for home TENS      Therapeutic Activites    Other Therapeutic Activities for standing, walking, sit to stand, stairs     Neuro Re-ed    Neuro Re-ed Details  quad and gluteal activation     Knee/Hip Exercises: Aerobic   Stationary Bike 6 min   Nustep L1 15 min     Knee/Hip Exercises: Machines for Strengthening   Total Gym Leg Press 55# 5x;   decreased to 35# still painful so diiscontinued     Knee/Hip Exercises: Seated   Other Seated Knee/Hip Exercises long discussion on aquatic ex options and benefits of this type of ex     Cryotherapy   Number Minutes Cryotherapy 20 Minutes   Cryotherapy Location Knee   Type of Cryotherapy Ice pack     Electrical Stimulation   Electrical Stimulation Location bilateral knees   lateral and anterior    Electrical Stimulation Action pre-mod   Electrical Stimulation Parameters 27 ma 20 min   Electrical Stimulation Goals Pain                  PT Short Term Goals - 04/22/17 1327      PT SHORT TERM GOAL #1   Title independent with initial HEP issued 5/30   Status Achieved     PT SHORT TERM GOAL #2   Title pt will report 25% reduced pain throughout the day   Time 4   Period Weeks   Status On-going     PT SHORT TERM GOAL #3  Title pt will be able to walk 1/2 mile due to increased strength and endurance   Status Achieved           PT Long Term Goals - 04/22/17 1327      PT LONG TERM GOAL #1   Title Pt will report 50% reduction of symptoms   Time 8   Period Weeks   Status On-going     PT LONG TERM GOAL #2   Title pt will be able to perform step up onto 6 inch step 10x without increased pain in order to work on funtional LE strength   Time 8   Period Weeks   Status On-going     PT LONG TERM GOAL #3   Title MMT knee extension 5/5 bilaterally for improved transfers   Time 8   Period Weeks   Status On-going     PT LONG TERM GOAL #4   Title pt will be able to tolerate 30 minute walk 5 days/week for transition to exercise program   Time 8   Status On-going     PT LONG TERM GOAL #5   Title pt independent with advanced HEP   Time 8   Period Weeks   Status On-going               Plan - 04/22/17 1315    Clinical Impression Statement The patient reports he is about the same.   Attempted leg press today but despite several modifications it is too  painful.  Discussed the benefits of aquatic ex with patient and community availability.  Also he has temporary pain relief with TENS and he plans to ask at his appt at the [place] next week for a home unit.  Minimal progress toward goals, possible discharge next visit.     Rehab Potential Good   PT Frequency 2x / week   PT Duration 8 weeks   PT Treatment/Interventions ADLs/Self Care Home Management;Biofeedback;Cryotherapy;Electrical Stimulation;Moist Heat;Ultrasound;Gait training;Stair training;Functional mobility training;Therapeutic activities;Therapeutic exercise;Balance training;Neuromuscular re-education;Patient/family education;Dry needling;Taping;Manual techniques   PT Next Visit Plan  open chain and low resistance LE strengthening;  bike or  Nu-Step;  electrical stim and ice for pain control;  check progress toward goals and if no further progress will discharge from PT       Patient will benefit from skilled therapeutic intervention in order to improve the following deficits and impairments:  Abnormal gait, Difficulty walking, Decreased strength, Pain, Obesity, Increased fascial restricitons, Increased edema  Visit Diagnosis: Chronic pain of left knee  Chronic pain of right knee  Muscle weakness (generalized)  Difficulty in walking, not elsewhere classified    PHYSICAL THERAPY DISCHARGE SUMMARY  Visits from Start of Care: 6  Current functional level related to goals / functional outcomes: The patient cancelled the last 3 appointments for various reasons.  He also had numerous other cancellations and no-shows.  He has not called back to resume services and his chart has been inactive for 2 months.     Remaining deficits: As above   Education / Equipment: Basic HEP Plan: Patient agrees to discharge.  Patient goals were not met. Patient is being discharged due to not returning since the last visit.  ?????        G code:  Mobilitymoving around   Goal CJ, discharge CJ Problem  List Patient Active Problem List   Diagnosis Date Noted  . Chronic diastolic heart failure ([place]) 10/29/2014  . Pulmonary edema 10/27/2014  . G6PD deficiency ([place]) 10/27/2014  .  Acute respiratory failure with hypoxemia ([place]) 10/27/2014  . Hypertensive heart disease 10/27/2014  . Smoker 10/27/2014  . Elevated troponin 10/27/2014  . Morbid obesity ([place]) 10/27/2014  . 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 Sleep apnea 10/27/2014   Ruben Im, PT 04/22/17 1:29 PM Phone: [phone] Fax: [phone]  Alvera Singh 04/22/2017, 1:29 PM  [facility] [address] [place], [place], [address] Phone: [phone]   Fax:  [phone]  Name: Derek Ibarra MRN: [id number] Date of Birth: 1956/04/14

## 2017-04-27 ENCOUNTER — Encounter: Payer: Non-veteran care | Admitting: Physical Therapy

## 2017-04-29 ENCOUNTER — Encounter: Payer: Non-veteran care | Admitting: Physical Therapy

## 2017-05-04 ENCOUNTER — Encounter: Payer: Non-veteran care | Admitting: Physical Therapy

## 2018-03-03 ENCOUNTER — Emergency Department (HOSPITAL_COMMUNITY)
Admission: EM | Admit: 2018-03-03 | Discharge: 2018-03-03 | Disposition: A | Discharge status: Home / Self Care | Payer: Non-veteran care | Attending: Emergency Medicine | Admitting: Emergency Medicine

## 2018-03-03 ENCOUNTER — Other Ambulatory Visit: Payer: Self-pay

## 2018-03-03 ENCOUNTER — Emergency Department (HOSPITAL_COMMUNITY): Payer: Non-veteran care

## 2018-03-03 ENCOUNTER — Encounter (HOSPITAL_COMMUNITY): Payer: Self-pay | Admitting: Emergency Medicine

## 2018-03-03 DIAGNOSIS — J36 Peritonsillar abscess: Secondary | ICD-10-CM | POA: Diagnosis not present

## 2018-03-03 DIAGNOSIS — Z794 Long term (current) use of insulin: Secondary | ICD-10-CM | POA: Insufficient documentation

## 2018-03-03 DIAGNOSIS — F17210 Nicotine dependence, cigarettes, uncomplicated: Secondary | ICD-10-CM | POA: Insufficient documentation

## 2018-03-03 DIAGNOSIS — E1165 Type 2 diabetes mellitus with hyperglycemia: Secondary | ICD-10-CM | POA: Insufficient documentation

## 2018-03-03 DIAGNOSIS — R600 Localized edema: Secondary | ICD-10-CM | POA: Diagnosis present

## 2018-03-03 DIAGNOSIS — J45909 Unspecified asthma, uncomplicated: Secondary | ICD-10-CM | POA: Insufficient documentation

## 2018-03-03 DIAGNOSIS — I5032 Chronic diastolic (congestive) heart failure: Secondary | ICD-10-CM | POA: Insufficient documentation

## 2018-03-03 DIAGNOSIS — I110 Hypertensive heart disease with heart failure: Secondary | ICD-10-CM | POA: Insufficient documentation

## 2018-03-03 DIAGNOSIS — Z79899 Other long term (current) drug therapy: Secondary | ICD-10-CM | POA: Diagnosis not present

## 2018-03-03 LAB — CBC WITH DIFFERENTIAL/PLATELET
BASOS ABS: 0 10*3/uL (ref 0.0–0.1)
Basophils Relative: 0 %
EOS ABS: 0.1 10*3/uL (ref 0.0–0.7)
EOS PCT: 0 %
HCT: 41.1 % (ref 39.0–52.0)
Hemoglobin: 13.7 g/dL (ref 13.0–17.0)
Lymphocytes Relative: 8 %
Lymphs Abs: 1.2 10*3/uL (ref 0.7–4.0)
MCH: 29 pg (ref 26.0–34.0)
MCHC: 33.3 g/dL (ref 30.0–36.0)
MCV: 86.9 fL (ref 78.0–100.0)
MONO ABS: 1.7 10*3/uL — AB (ref 0.1–1.0)
Monocytes Relative: 11 %
Neutro Abs: 12.8 10*3/uL — ABNORMAL HIGH (ref 1.7–7.7)
Neutrophils Relative %: 81 %
PLATELETS: 272 10*3/uL (ref 150–400)
RBC: 4.73 MIL/uL (ref 4.22–5.81)
RDW: 13.7 % (ref 11.5–15.5)
WBC: 15.7 10*3/uL — AB (ref 4.0–10.5)

## 2018-03-03 LAB — BASIC METABOLIC PANEL
Anion gap: 14 (ref 5–15)
BUN: 23 mg/dL — ABNORMAL HIGH (ref 6–20)
CO2: 26 mmol/L (ref 22–32)
CREATININE: 1.63 mg/dL — AB (ref 0.61–1.24)
Calcium: 9.8 mg/dL (ref 8.9–10.3)
Chloride: 100 mmol/L — ABNORMAL LOW (ref 101–111)
GFR, EST AFRICAN AMERICAN: 50 mL/min — AB (ref >60)
GFR, EST NON AFRICAN AMERICAN: 43 mL/min — AB (ref >60)
Glucose, Bld: 475 mg/dL — ABNORMAL HIGH (ref 65–99)
POTASSIUM: 3.5 mmol/L (ref 3.5–5.1)
SODIUM: 140 mmol/L (ref 135–145)

## 2018-03-03 MED ORDER — METFORMIN HCL 500 MG PO TABS
500.0000 mg | ORAL_TABLET | Freq: Once | ORAL | Status: AC
  Administered 2018-03-03: 500 mg via ORAL
  Filled 2018-03-03: qty 1

## 2018-03-03 MED ORDER — HYDROMORPHONE HCL 1 MG/ML IJ SOLN
0.5000 mg | Freq: Once | INTRAMUSCULAR | Status: AC
  Administered 2018-03-03: 0.5 mg via INTRAVENOUS
  Filled 2018-03-03: qty 1

## 2018-03-03 MED ORDER — INSULIN ASPART PROT & ASPART (70-30 MIX) 100 UNIT/ML ~~LOC~~ SUSP
40.0000 [IU] | Freq: Once | SUBCUTANEOUS | Status: AC
Start: 2018-03-03 — End: 2018-03-03
  Administered 2018-03-03: 40 [IU] via SUBCUTANEOUS
  Filled 2018-03-03: qty 10

## 2018-03-03 MED ORDER — IOHEXOL 300 MG/ML  SOLN
75.0000 mL | Freq: Once | INTRAMUSCULAR | Status: AC | PRN
  Administered 2018-03-03: 60 mL via INTRAVENOUS

## 2018-03-03 MED ORDER — CLINDAMYCIN PHOSPHATE 600 MG/50ML IV SOLN
600.0000 mg | Freq: Once | INTRAVENOUS | Status: AC
  Administered 2018-03-03: 600 mg via INTRAVENOUS
  Filled 2018-03-03: qty 50

## 2018-03-03 NOTE — ED Provider Notes (Addendum)
Pt handed off from previous EDP at shift change pending CT scan results. Please see previous note for full H&P.   Briefly, here with recurrent submental edema, warmth, pain x 10 days ago. Recently tx for infected lymph nodes at [facility] and finished abx. Associated with subjective fevers, changes in voice, drooling, trismus, difficulty breathing. H/o DM. Physical Exam  BP (!) 181/99 (BP Location: Right Arm)   Pulse 99   Temp 97.9 F (36.6 C) (Oral)   Resp 18   Ht 5\' 9"  (1.753 m)   Wt 128.8 kg (284 lb)   SpO2 96%   BMI 41.94 kg/m   Physical Exam  Constitutional: He is oriented to person, place, and time.  Sitting on hall bed. NAD  HENT:  Trismus (2 fingers), hot potato voice. Unable to visualize oropharynx due to large tongue. No SL edema or tenderness. No drooling or stridor.   Eyes: EOM are normal.  Neck:  Moderate submental edema and tenderness R >L. Pain with neck flexion.   Cardiovascular: Normal rate and regular rhythm.  Pulmonary/Chest: Effort normal and breath sounds normal.  Musculoskeletal: Normal range of motion.  Neurological: He is alert and oriented to person, place, and time.  Skin: Skin is warm and dry.    ED Course/Procedures   Clinical Course as of Mar 08 2229  Thu Mar 03, 2018  0843 WBC(!): 15.7 [CG]  0843 CT Soft Tissue Neck W Contrast [CG]  4650 IMPRESSION: Suspected 3 cm RIGHT peritonsillar abscess/phlegmon, with regional inflammation as described. Infiltrative squamous cell carcinoma could have this appearance, but is less favored, particularly in the setting of elevated white count and previous treatment with antibiotics. RIGHT-to-LEFT mass effect on the airway. Regional adenopathy.  Findings discussed with emergency department provider at the time of interpretation. ENT consultation is warranted.     [CG]    Clinical Course User Index [CG] Kinnie Feil, PA-C    Procedures  MDM   0930:imaging reviewed, as above. Spoke to Dr Redmond Baseman who  requests patient goes to his office now for evaluation. Discussed plan with patient who is in agreement. He is ambulatory in ER, tolerating PO , in NAD.  Pt is appropriate to go to ENT office via POV.  Discussed importance of going to ENT immediately after dc and pt verbalized understanding. He is aware if unable to make it to ENT he is to call 911 and return to ER. Pt given clindamycin before dc.       Kinnie Feil, PA-C 03/03/18 0940    Shanon Rosser, MD 03/03/18 2240    Shanon Rosser, MD 03/08/18 2229    Shanon Rosser, MD 03/08/18 2230

## 2018-03-03 NOTE — ED Triage Notes (Signed)
Pt c/o rt sided facial swelling due to an infected lymph node.  Pt states that it is hard for him to swallow due to pain.  Has had this problem since April but states that he couldn't sleep tonight due to pain.  Pt states that he has hypertension but hasn't been able to take his pills like he should because of how bad his face/jaw hurts.

## 2018-03-03 NOTE — ED Provider Notes (Signed)
[facility] DEPT Provider Note   CSN: [id number] Arrival date & time: 03/03/18  0019     History   Chief Complaint Chief Complaint  Patient presents with  . Facial Swelling    HPI Derek Ibarra is a 62 y.o. male.  Patient with a history of T2DM, HTN, CHF, asthma presents with recurrent symptoms of swollen anterior neck, painful/difficult swallowing, mild airway compromise described as "it feels like breathing is harder". No fever, nausea, vomiting or dental pain. He reports he was diagnosed with 'infected lymph nodes" at the [facility] by MRI and started on antibiotics. His symptoms were improving and became worse again over the last week. He has been unable to take his regular medications secondary to swallowing difficulty. He was seen this week and given ibuprofen for pain but reports his pain has become severe and not controlled with ibuprofen use.   The history is provided by the patient. No language interpreter was used.    Past Medical History:  Diagnosis Date  . G6PD deficiency ([place])   . Hypertension   . Hypertensive heart disease   . Morbid obesity ([place])   . OSA on CPAP    16 CM pressure  . Sleep apnea     Patient Active Problem List   Diagnosis Date Noted  . Chronic diastolic heart failure ([place]) 10/29/2014  . Pulmonary edema 10/27/2014  . G6PD deficiency ([place]) 10/27/2014  . Acute respiratory failure with hypoxemia ([place]) 10/27/2014  . Hypertensive heart disease 10/27/2014  . Smoker 10/27/2014  . Elevated troponin 10/27/2014  . Morbid obesity ([place]) 10/27/2014  . 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 Sleep apnea 10/27/2014    Past Surgical History:  Procedure Laterality Date  . KNEE SURGERY          Home Medications    Prior to Admission medications   Medication Sig Start Date End Date Taking? Authorizing Provider  albuterol (PROVENTIL HFA;VENTOLIN HFA) 108 (90 BASE) MCG/ACT inhaler Inhale  into the lungs every 6 (six) hours as needed for wheezing or shortness of breath.   Yes [provider]  allopurinol (ZYLOPRIM) 100 MG tablet Take 200 mg by mouth daily.   Yes [provider]  carvedilol (COREG) 25 MG tablet Take 25 mg by mouth 2 (two) times daily with a meal.   Yes [provider]  cloNIDine (CATAPRES) 0.1 MG tablet Take 0.1 mg by mouth 2 (two) times daily.   Yes [provider]  ferrous sulfate 325 (65 FE) MG tablet Take 325 mg by mouth daily with breakfast.   Yes [provider]  hydrALAZINE (APRESOLINE) 100 MG tablet Take 100 mg by mouth 3 (three) times daily.   Yes [provider]  insulin glargine (LANTUS) 100 UNIT/ML injection Inject 40 Units into the skin 2 (two) times daily.   Yes [provider]  lisinopril (PRINIVIL,ZESTRIL) 40 MG tablet Take 40 mg by mouth daily.   Yes [provider]  metFORMIN (GLUCOPHAGE-XR) 500 MG 24 hr tablet Take 500 mg by mouth daily with breakfast.   Yes [provider]  Multiple Vitamin (MULTIVITAMIN WITH MINERALS) TABS tablet Take 1 tablet by mouth daily.   Yes [provider]  potassium chloride SA (K-DUR,KLOR-CON) 20 MEQ tablet Take 20 mEq by mouth daily.   Yes [provider]  rosuvastatin (CRESTOR) 10 MG tablet Take 10 mg by mouth daily.   Yes [provider]  cefpodoxime (VANTIN) 200 MG tablet  Take 1 tablet (200 mg total) by mouth every 12 (twelve) hours. For 4 more days. Patient not taking: Reported on 03/03/2018 10/29/14   Bonnielee Haff, MD  furosemide (LASIX) 20 MG tablet Take 2 tablets (40 mg total) by mouth daily. Patient not taking: Reported on 03/03/2018 10/29/14   Bonnielee Haff, MD  predniSONE (DELTASONE) 20 MG tablet Take 3 tablets once daily for 2 days, then take 2 tablets once daily for 3 days, then take 1 tablet once daily for 3 days and then stop. Patient not taking: Reported on 03/03/2018 10/29/14   Bonnielee Haff, MD     Family History History reviewed. No pertinent family history.  Social History Social History   Tobacco Use  . Smoking status: Current Every Day Smoker    Packs/day: 1.00    Years: 30.00    Pack years: 30.00    Types: Cigarettes  . Smokeless tobacco: Never Used  Substance Use Topics  . Alcohol use: No    Alcohol/week: 0.0 oz  . Drug use: No     Allergies   Penicillins and Sulfa antibiotics   Review of Systems Review of Systems  Constitutional: Negative for fever.  HENT: Positive for facial swelling and trouble swallowing. Negative for dental problem.   Respiratory: Negative for cough and shortness of breath.        See HPI.  Cardiovascular: Negative for chest pain.  Gastrointestinal: Negative for abdominal pain, nausea and vomiting.  Skin: Negative for color change.  Neurological: Negative for facial asymmetry and headaches.     Physical Exam Updated Vital Signs BP (!) 205/101 (BP Location: Right Arm)   Pulse (!) 102   Temp 98.6 F (37 C) (Oral)   Resp 18   Ht 5\' 9"  (1.753 m)   Wt 128.8 kg (284 lb)   SpO2 100%   BMI 41.94 kg/m   Physical Exam  Constitutional: He is oriented to person, place, and time. He appears well-developed and well-nourished.  HENT:  Trismus present. Large submental lymph node swelling that is tender to any palpation. No fluctuance.   Neck: Normal range of motion.  Pulmonary/Chest: Effort normal.  Musculoskeletal: Normal range of motion.  Neurological: He is alert and oriented to person, place, and time.  Skin: Skin is warm and dry.  Psychiatric: He has a normal mood and affect.     ED Treatments / Results  Labs (all labs ordered are listed, but only abnormal results are displayed) Labs Reviewed  BASIC METABOLIC PANEL  CBC WITH DIFFERENTIAL/PLATELET    EKG None  Radiology No results found.  Procedures Procedures (including critical care time)  Medications Ordered in ED Medications  HYDROmorphone (DILAUDID)  injection 0.5 mg (has no administration in time range)  clindamycin (CLEOCIN) IVPB 600 mg (has no administration in time range)     Initial Impression / Assessment and Plan / ED Course  I have reviewed the triage vital signs and the nursing notes.  Pertinent labs & imaging results that were available during my care of the patient were reviewed by me and considered in my medical decision making (see chart for details).     Patient here with recurrent symptoms previously diagnosed as cervical lymphadenitis that was improving while on antibiotics, now worsening. No fever. He does report pain is causing inability to take regular medications and trismus is present. Previous imaging is not available for review. Feel re-imaging is warranted. CT scan ordered.   IV started and pain medications provided. He reports some relief.  Labspending.  He has a mild leukocytosis. Remains afebrile. CBG shows elevated sugar to 475 without evidence of acidosis. Morning Metformin and insulin ordered, in addition to second dose pain medications. CT pending.   Patient care signed out to Carmon Sails, PA-C pending CT results and final disposition.   Final Clinical Impressions(s) / ED Diagnoses   Final diagnoses:  None   1. Lymphadenopathy 2. Hyperglycemia  ED Discharge Orders    None       Charlann Lange, Hershal Coria 03/03/18 0744    Shanon Rosser, MD 03/08/18 2232

## 2018-03-03 NOTE — Discharge Instructions (Signed)
Go to Dr Redmond Baseman office immediately for evaluation. He will continue care and management.

## 2019-05-08 ENCOUNTER — Encounter (HOSPITAL_COMMUNITY): Payer: Self-pay

## 2019-05-08 ENCOUNTER — Emergency Department (HOSPITAL_COMMUNITY): Payer: No Typology Code available for payment source

## 2019-05-08 ENCOUNTER — Emergency Department (HOSPITAL_COMMUNITY)
Admission: EM | Admit: 2019-05-08 | Discharge: 2019-05-09 | Disposition: A | Discharge status: Home / Self Care | Payer: No Typology Code available for payment source | Attending: Emergency Medicine | Admitting: Emergency Medicine

## 2019-05-08 ENCOUNTER — Other Ambulatory Visit: Payer: Self-pay

## 2019-05-08 DIAGNOSIS — Z794 Long term (current) use of insulin: Secondary | ICD-10-CM | POA: Diagnosis not present

## 2019-05-08 DIAGNOSIS — Y999 Unspecified external cause status: Secondary | ICD-10-CM | POA: Insufficient documentation

## 2019-05-08 DIAGNOSIS — S52572A Other intraarticular fracture of lower end of left radius, initial encounter for closed fracture: Secondary | ICD-10-CM | POA: Insufficient documentation

## 2019-05-08 DIAGNOSIS — I110 Hypertensive heart disease with heart failure: Secondary | ICD-10-CM | POA: Diagnosis not present

## 2019-05-08 DIAGNOSIS — I5032 Chronic diastolic (congestive) heart failure: Secondary | ICD-10-CM | POA: Diagnosis not present

## 2019-05-08 DIAGNOSIS — Y9389 Activity, other specified: Secondary | ICD-10-CM | POA: Insufficient documentation

## 2019-05-08 DIAGNOSIS — E11319 Type 2 diabetes mellitus with unspecified diabetic retinopathy without macular edema: Secondary | ICD-10-CM | POA: Insufficient documentation

## 2019-05-08 DIAGNOSIS — Y929 Unspecified place or not applicable: Secondary | ICD-10-CM | POA: Diagnosis not present

## 2019-05-08 DIAGNOSIS — W19XXXA Unspecified fall, initial encounter: Secondary | ICD-10-CM

## 2019-05-08 DIAGNOSIS — F17210 Nicotine dependence, cigarettes, uncomplicated: Secondary | ICD-10-CM | POA: Diagnosis not present

## 2019-05-08 DIAGNOSIS — S42292A Other displaced fracture of upper end of left humerus, initial encounter for closed fracture: Secondary | ICD-10-CM | POA: Diagnosis not present

## 2019-05-08 DIAGNOSIS — S4992XA Unspecified injury of left shoulder and upper arm, initial encounter: Secondary | ICD-10-CM | POA: Diagnosis present

## 2019-05-08 DIAGNOSIS — R7989 Other specified abnormal findings of blood chemistry: Secondary | ICD-10-CM | POA: Diagnosis not present

## 2019-05-08 DIAGNOSIS — Z880 Allergy status to penicillin: Secondary | ICD-10-CM | POA: Diagnosis not present

## 2019-05-08 LAB — CBC
HCT: 46.5 % (ref 39.0–52.0)
Hemoglobin: 14.4 g/dL (ref 13.0–17.0)
MCH: 27.6 pg (ref 26.0–34.0)
MCHC: 31 g/dL (ref 30.0–36.0)
MCV: 89.1 fL (ref 80.0–100.0)
Platelets: 263 10*3/uL (ref 150–400)
RBC: 5.22 MIL/uL (ref 4.22–5.81)
RDW: 14.9 % (ref 11.5–15.5)
WBC: 11.2 10*3/uL — ABNORMAL HIGH (ref 4.0–10.5)
nRBC: 0 % (ref 0.0–0.2)

## 2019-05-08 LAB — CBG MONITORING, ED: Glucose-Capillary: 113 mg/dL — ABNORMAL HIGH (ref 70–99)

## 2019-05-08 LAB — BASIC METABOLIC PANEL
Anion gap: 15 (ref 5–15)
BUN: 26 mg/dL — ABNORMAL HIGH (ref 8–23)
CO2: 22 mmol/L (ref 22–32)
Calcium: 9.4 mg/dL (ref 8.9–10.3)
Chloride: 103 mmol/L (ref 98–111)
Creatinine, Ser: 2.28 mg/dL — ABNORMAL HIGH (ref 0.61–1.24)
GFR calc Af Amer: 34 mL/min — ABNORMAL LOW (ref >60)
GFR calc non Af Amer: 29 mL/min — ABNORMAL LOW (ref >60)
Glucose, Bld: 130 mg/dL — ABNORMAL HIGH (ref 70–99)
Potassium: 3.4 mmol/L — ABNORMAL LOW (ref 3.5–5.1)
Sodium: 140 mmol/L (ref 135–145)

## 2019-05-08 MED ORDER — HYDROMORPHONE HCL 1 MG/ML IJ SOLN
1.0000 mg | Freq: Once | INTRAMUSCULAR | Status: AC
  Administered 2019-05-08: 1 mg via INTRAMUSCULAR
  Filled 2019-05-08: qty 1

## 2019-05-08 NOTE — ED Provider Notes (Signed)
[facility] DEPT Provider Note   CSN: [id number] Arrival date & time: 05/08/19  2212    History   Chief Complaint Chief Complaint  Patient presents with   Hypoglycemia   Fall    left shoulder    HPI Derek Ibarra is a 63 y.o. male with a history of hypertension, chronic diastolic heart failure, O1YY deficiency, DM Type II, and OSA on CPAP who presents to the emergency department with a chief complaint of fall.  The patient reports that he was with his family in the car getting dinner at a drive-through.  He reports that he had a couple sips of lemonade, but had not yet eaten this food.  He reports that he does not remember the remainder of the car ride home.  The next thing that he recalls is that he was laying on the ground.  He reports that his wife told him that he fell while getting out of the car.  EMS placed his left arm in a temporary sling and he has dirt and debris on the left side of his shirt.  He is unsure if he syncopized.  He denies headache, nausea, or vomiting.  In the ER, he is endorsing pain in the left wrist and left shoulder.  No numbness or weakness.  Denies chest pain, numbness, weakness, abdominal pain, neck pain, back pain, or pain in the bilateral lower extremities.  No treatment prior to arrival.  He has not taken his nighttime dose of his home medications.  He reports that he has been on the quickset pen for his diabetes mellitus, but has not yet received his refill from the [place].  He reports that for the last 3 days he has been using a previous prescription of Humalog as he was previously on a sliding scale with 90 units in the morning and 60 units at night.  He reports that he awoke yesterday and was diaphoretic and confused. When he checked his blood sugar yesterday morning and it was 38 before eating, but improved to 83 after having coffee and breakfast.  His blood sugar this morning was 190.  He also reports that he feels as if his  abdomen is more swollen.  He reports some associated swelling in his bilateral lower extremities.  He denies shortness of breath, dizziness, lightheadedness, palpitations, fever, chills, cough.  He has been compliant with his home Lasix with no missed doses.   He is right-handed.      The history is provided by the patient. No language interpreter was used.    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 Authorizing Provider  albuterol (PROVENTIL HFA;VENTOLIN HFA) 108 (90 BASE) MCG/ACT inhaler Inhale into the lungs every 6 (six) hours as needed for wheezing or shortness of breath.    [provider]  allopurinol (ZYLOPRIM) 100 MG tablet Take 200 mg by mouth daily.    [provider]  carvedilol (COREG) 25 MG tablet Take 25 mg by mouth 2 (two) times daily with a meal.    [provider]  cefpodoxime (VANTIN) 200 MG tablet Take 1 tablet (200 mg total) by mouth every 12 (twelve) hours. For 4 more days. Patient not taking: Reported on 03/03/2018 10/29/14   Bonnielee Haff, MD  cloNIDine (CATAPRES) 0.1 MG tablet Take 0.1 mg by mouth 2 (two) times daily.    [provider]  ferrous  sulfate 325 (65 FE) MG tablet Take 325 mg by mouth daily with breakfast.    [provider]  furosemide (LASIX) 20 MG tablet Take 2 tablets (40 mg total) by mouth daily. Patient not taking: Reported on 03/03/2018 10/29/14   Bonnielee Haff, MD  hydrALAZINE (APRESOLINE) 100 MG tablet Take 100 mg by mouth 3 (three) times daily.    [provider]  insulin glargine (LANTUS) 100 UNIT/ML injection Inject 40 Units into the skin 2 (two) times daily.    [provider]  lisinopril (PRINIVIL,ZESTRIL) 40 MG tablet Take 40 mg by mouth daily.    [provider]  metFORMIN (GLUCOPHAGE-XR) 500 MG 24 hr tablet Take 500 mg by mouth daily with breakfast.    [provider]  Multiple Vitamin (MULTIVITAMIN WITH MINERALS) TABS tablet Take 1 tablet by mouth daily.    [provider]  oxyCODONE-acetaminophen (PERCOCET/ROXICET) 5-325 MG tablet Take 1 tablet by mouth every 4 (four) hours as needed for severe pain. 05/09/19   Jaquan Sadowsky A, PA-C  potassium chloride SA (K-DUR,KLOR-CON) 20 MEQ tablet Take 20 mEq by mouth daily.    [provider]  predniSONE (DELTASONE) 20 MG tablet Take 3 tablets once daily for 2 days, then take 2 tablets once daily for 3 days, then take 1 tablet once daily for 3 days and then stop. Patient not taking: Reported on 03/03/2018 10/29/14   Bonnielee Haff, MD  rosuvastatin (CRESTOR) 10 MG tablet Take 10 mg by mouth daily.    [provider]    Family History No family history on file.  Social History Social History   Tobacco Use   Smoking status: Current Every Day Smoker    Packs/day: 1.00    Years: 30.00    Pack years: 30.00    Types: Cigarettes   Smokeless tobacco: Never Used  Substance Use Topics   Alcohol use: No    Alcohol/week: 0.0 standard drinks   Drug use: No     Allergies   Penicillins and Sulfa antibiotics   Review of Systems Review of Systems  Constitutional: Negative for appetite change,  chills and fever.  HENT: Negative for congestion and sore throat.   Eyes: Negative for visual disturbance.  Respiratory: Positive for shortness of breath.   Cardiovascular: Positive for leg swelling. Negative for chest pain.  Gastrointestinal: Positive for abdominal distention. Negative for abdominal pain, blood in stool, constipation, diarrhea, nausea and vomiting.  Genitourinary: Positive for urgency. Negative for dysuria.  Musculoskeletal: Positive for arthralgias and myalgias. Negative for back pain, gait problem and neck pain.  Skin: Negative for rash.  Allergic/Immunologic: Negative for immunocompromised state.  Neurological: Negative for dizziness, weakness, numbness and headaches.  Psychiatric/Behavioral:  Negative for confusion.     Physical Exam Updated Vital Signs BP (!) 195/97    Pulse 99    Temp 98 F (36.7 C) (Oral)    Resp 17    Ht 6\' 2"  (1.88 m)    Wt 136.1 kg    SpO2 99%    BMI 38.52 kg/m   Physical Exam Vitals signs and nursing note reviewed.  Constitutional:      Appearance: He is well-developed. He is obese. He is not ill-appearing or toxic-appearing.  HENT:     Head: Normocephalic.  Eyes:     Conjunctiva/sclera: Conjunctivae normal.  Neck:     Musculoskeletal: Normal range of motion and neck supple.  Cardiovascular:     Rate and Rhythm: Normal rate and regular rhythm.     Heart sounds: No murmur. No friction rub. No gallop.   Pulmonary:     Effort: Pulmonary effort is normal. No respiratory distress.     Breath sounds: No stridor. No wheezing, rhonchi or rales.  Chest:     Chest wall: No tenderness.  Abdominal:     General: There is distension.     Palpations: Abdomen is soft. There is no mass.     Tenderness: There is no abdominal tenderness. There is no right CVA tenderness, left CVA tenderness, guarding or rebound.     Hernia: No hernia is present.     Comments: Abdomen is distended but soft.  Nontender.  Musculoskeletal:     Right lower leg: Edema  present.     Left lower leg: Edema present.     Comments: Tender to palpation over the dorsum of the left wrist.  Decreased range of motion secondary to pain.  Tender to palpation diffusely over the left shoulder and proximal humerus.  No obvious deformities.  Decreased range of motion secondary to pain.  No tenderness over the left clavicle, scapula, or left elbow.  Full active and passive range of motion of the left elbow.  Radial pulses are 2+ and symmetric.  Sensation is intact and equal throughout the bilateral upper and lower extremities.  Normal exam of the right upper extremity.  Pitting edema noted to the bilateral lower extremities.  Skin:    General: Skin is warm and dry.  Neurological:     Mental Status: He is alert.  Psychiatric:        Behavior: Behavior normal.      ED Treatments / Results  Labs (all labs ordered are listed, but only abnormal results are displayed) Labs Reviewed  CBC - Abnormal; Notable for the following components:      Result Value   WBC 11.2 (*)    All other components within normal limits  BASIC METABOLIC PANEL - Abnormal; Notable for the following components:   Potassium 3.4 (*)    Glucose, Bld 130 (*)    BUN 26 (*)    Creatinine, Ser 2.28 (*)    GFR calc non Af Amer 29 (*)    GFR calc Af Amer 34 (*)    All other components within normal limits  CBG MONITORING, ED - Abnormal; Notable for the following components:   Glucose-Capillary 113 (*)    All other components within normal limits  BRAIN NATRIURETIC PEPTIDE    EKG None  Radiology Dg Chest 2 View  Result Date: 05/08/2019 CLINICAL DATA:  Fall onto left arm ,shortness of breath EXAM: CHEST - 2 VIEW COMPARISON:  October 27, 2014. FINDINGS: There is mild cardiomegaly. No  large airspace consolidation or pneumothorax. There is a nondisplaced proximal left humerus fracture. IMPRESSION: 1. No acute cardiopulmonary process 2. Mild cardiomegaly 3. Nondisplaced proximal left humerus fracture.  Electronically Signed   By: Prudencio Pair M.D.   On: 05/08/2019 23:59   Dg Elbow Complete Left  Result Date: 05/09/2019 CLINICAL DATA:  Fall onto arm EXAM: LEFT ELBOW - COMPLETE 3+ VIEW COMPARISON:  None. FINDINGS: No acute displaced fracture or malalignment allowing for nonstandard positioning. Limited evaluation for elbow effusion. Soft tissues unremarkable. IMPRESSION: No definite acute osseous abnormality Electronically Signed   By: Donavan Foil M.D.   On: 05/09/2019 00:05   Dg Wrist Complete Left  Result Date: 05/09/2019 CLINICAL DATA:  Fall onto arm EXAM: LEFT WRIST - COMPLETE 3+ VIEW COMPARISON:  None. FINDINGS: Acute comminuted nondisplaced intra-articular fracture involving the distal radius. No dislocation. No radiopaque foreign body in the soft tissues. IMPRESSION: Acute nondisplaced intra-articular distal radius fracture Electronically Signed   By: Donavan Foil M.D.   On: 05/09/2019 00:06   Dg Shoulder Left  Result Date: 05/09/2019 CLINICAL DATA:  Fall onto left arm EXAM: LEFT SHOULDER - 2+ VIEW COMPARISON:  None. FINDINGS: There is a transversely oriented fracture seen through the proximal humerus with 1 shaft width medial displacement of the remainder of the humeral shaft. The humeral head appears to be well seated within the glenoid. Overlying soft tissue swelling is seen. IMPRESSION: Proximal humerus fracture. Electronically Signed   By: Prudencio Pair M.D.   On: 05/09/2019 00:02    Procedures Procedures (including critical care time)  Medications Ordered in ED Medications  HYDROmorphone (DILAUDID) injection 1 mg (1 mg Intramuscular Given 05/08/19 2317)  oxyCODONE-acetaminophen (PERCOCET/ROXICET) 5-325 MG per tablet 1 tablet (1 tablet Oral Given 05/09/19 0058)     Initial Impression / Assessment and Plan / ED Course  I have reviewed the triage vital signs and the nursing notes.  Pertinent labs & imaging results that were available during my care of the patient were reviewed  by me and considered in my medical decision making (see chart for details).        63 year old male with a history of hypertension, chronic diastolic heart failure, W6FK deficiency, DM Type II, and OSA on CPAP resenting with questionable syncopal episode resulting in a fall onto his left side.  Denies hitting his head, headache, nausea, or vomiting.  He has having pain over the left proximal humerus and left wrist.  No obvious deformity on exam.  He has abdominal distention, but abdomen is soft and nontender.  1-2+ pitting edema in the bilateral lower extremities.  Sounds as if his paroxysmal hypoglycemia is secondary to an error with his [facility] refill from the [place].  He was sent Humalog 90/60 by mistake.  Symptoms began after he ran out of his [facility] medication two days ago.  He is unsure of the exact dosing and since he gets his medications refilled at the pharmacy, this is unavailable.  I spoke with the pharmacist, JC, who also was unsure of which specific [facility] formulation the patient was taking.  Glucose in the ER is 130.  Anion gap and bicarb are normal.  Creatinine is 2.28, up from 1.6 a year ago.  BNP is not elevated and there is a vascular congestion on chest x-ray.  He does not check his weight at home, but he does deny orthopnea.  Imaging is notable for a displaced proximal humerus fracture and an intra-articular distal radius fracture.  The patient was  seen and independently evaluated by Dr. Wynona Canes, attending physician.  We will place the patient in a sugar tong splint for left distal radius fracture and shoulder immobilizer for left proximal humerus fracture.  Given the patient's injuries with episodes of hypoglycemia over the last few days and a new AKI, I recommended admission for the patient.  He declined and would like to be discharged home with pain medication and to follow-up with hand surgery and orthopedics at the [facility]. he plans to call his PCP tomorrow morning to get a  refill of his [facility].   I have discussed my concerns as a provider and the possibility that this may worsen. We discussed the nature, risks and benefits, and alternatives to treatment. I have specifically discussed that without further evaluation I cannot guarantee there is not a life threatening event occuring.  Time was given to allow the opportunity to ask questions and consider the options, and after the discussion, the patient decided to refuse the offered treatment. Pt is A&Ox4, his own POA and states understanding of my concerns and the possible consequences. After refusal, I made every reasonable opportunity to treat them to the best of my ability. I have made the patient aware that this is an [facility] discharge, but he may return at any time for further evaluation and treatment.  He is hypertensive, but has not taken his nighttime dose of Catapres.  He is having no symptoms related to hypertensive urgency or emergency.  He is not short of breath and has no hypoxia or tachypnea.   A 64-month prescription history query was performed using the [place] CSRS prior to discharge.  The patient was given return precautions to the ER.  At discharge, he was hemodynamically stable and in no acute distress.     Final Clinical Impressions(s) / ED Diagnoses   Final diagnoses:  Fall, initial encounter  Other closed displaced fracture of proximal end of left humerus, initial encounter  Other closed intra-articular fracture of distal end of left radius, initial encounter  Elevated serum creatinine    ED Discharge Orders         Ordered    oxyCODONE-acetaminophen (PERCOCET/ROXICET) 5-325 MG tablet  Every 4 hours PRN     05/09/19 0100           Johney Perotti A, PA-C 05/09/19 1007    Rolland Porter, MD 05/09/19 0330

## 2019-05-08 NOTE — ED Notes (Signed)
Patient transported to X-ray 

## 2019-05-08 NOTE — ED Triage Notes (Signed)
Per ems: Pt coming from home c/o fall with pain to the left shoulder and wrist due to hypoglycemia. No noted deformities. Recent changes to medication. CBG yesterday-38 and today CBG-59. Pt was confused when he fell. No LOC or blood thinners. Currently A&Ox4 and ambulatory. Hx of HTN and did not take meds tonight due to incident.   CBG-93

## 2019-05-09 LAB — BRAIN NATRIURETIC PEPTIDE: B Natriuretic Peptide: 67.7 pg/mL (ref 0.0–100.0)

## 2019-05-09 MED ORDER — OXYCODONE-ACETAMINOPHEN 5-325 MG PO TABS: 1.0000 | ORAL_TABLET | ORAL | 0 refills | Status: AC | PRN

## 2019-05-09 MED ORDER — OXYCODONE-ACETAMINOPHEN 5-325 MG PO TABS
1.0000 | ORAL_TABLET | Freq: Once | ORAL | Status: AC
  Administered 2019-05-09: 1 via ORAL
  Filled 2019-05-09: qty 1

## 2019-05-09 NOTE — ED Notes (Signed)
Pt verbalized discharge instructions and follow up care. Alert and ambulatory. No iv. Wife is picking him up

## 2019-05-09 NOTE — Discharge Instructions (Addendum)
Thank you for allowing me to care for you today in the Emergency Department.   Pain medication has been called into the [facility] on [address].  Since we were unable to verify which [facility] prescription you are taking (there are LOTS of them), please call your endocrinologist or your primary care provider tomorrow to have them obtain a refill of your medication.  Continue to take your home metformin as prescribed.  Your blood sugar may be elevated until you receive a refill of this prescription, but the consequences of calling in the incorrect prescription may cause you to have another episode of low blood sugar.  Please follow-up with an orthopedist regarding the fracture of your left upper arm.  Please follow-up with a hand surgeon at the [facility] regarding the fracture of your left wrist since it goes into the joint space.  Wear the shoulder immobilizer 24 hours a day until you are seen by orthopedics.  Make sure to keep the splint on your left wrist clean and dry until you are seen by hand surgery.  To bathe, you can cover the arm in a plastic bag to keep it clean and dry.   You can take 600 mg of ibuprofen for pain control.  For severe pain, you can take 1 tablet of Percocet every 4 hours.  Do not drive while taking this medication because it can cause you to be impaired.  Do not take other sedating substances, such as alcohol, while you are taking this medication.  Each tablet of Percocet contains 325 mg of Tylenol.  Do not take more than 4000 mg of Tylenol from all sources in a 24-hour period.  Your kidney function was elevated today (creatinine) at 2.28 please follow-up with primary care to have this rechecked.   As we discussed, you were offered admission today, but declined.  You can return to the emergency department for reevaluation at any time, but particularly if you have another episode you possibly pass out, have a fall, if your fingers turn blue, if you develop significant pain or  swelling in your left arm that is uncontrollable, new numbness or weakness, if you stop making urine, if you develop respiratory distress, or if you develop other new, concerning symptoms.

## 2019-10-13 HISTORY — PX: HUMERUS FRACTURE SURGERY: SHX670

## 2019-10-14 ENCOUNTER — Emergency Department (HOSPITAL_COMMUNITY): Payer: Medicare Other

## 2019-10-14 ENCOUNTER — Inpatient Hospital Stay (HOSPITAL_COMMUNITY)
Admission: EM | Admit: 2019-10-14 | Discharge: 2019-10-17 | DRG: 291 | Disposition: A | Discharge status: Home / Self Care | Payer: Medicare Other | Attending: Internal Medicine | Admitting: Internal Medicine

## 2019-10-14 ENCOUNTER — Other Ambulatory Visit: Payer: Self-pay

## 2019-10-14 ENCOUNTER — Encounter (HOSPITAL_COMMUNITY): Payer: Self-pay

## 2019-10-14 DIAGNOSIS — I130 Hypertensive heart and chronic kidney disease with heart failure and stage 1 through stage 4 chronic kidney disease, or unspecified chronic kidney disease: Principal | ICD-10-CM | POA: Diagnosis present

## 2019-10-14 DIAGNOSIS — J9601 Acute respiratory failure with hypoxia: Secondary | ICD-10-CM | POA: Diagnosis not present

## 2019-10-14 DIAGNOSIS — N183 Chronic kidney disease, stage 3 unspecified: Secondary | ICD-10-CM | POA: Diagnosis present

## 2019-10-14 DIAGNOSIS — E876 Hypokalemia: Secondary | ICD-10-CM | POA: Diagnosis not present

## 2019-10-14 DIAGNOSIS — I110 Hypertensive heart disease with heart failure: Secondary | ICD-10-CM | POA: Diagnosis not present

## 2019-10-14 DIAGNOSIS — G4733 Obstructive sleep apnea (adult) (pediatric): Secondary | ICD-10-CM | POA: Diagnosis present

## 2019-10-14 DIAGNOSIS — R0602 Shortness of breath: Secondary | ICD-10-CM

## 2019-10-14 DIAGNOSIS — Z79899 Other long term (current) drug therapy: Secondary | ICD-10-CM

## 2019-10-14 DIAGNOSIS — E11319 Type 2 diabetes mellitus with unspecified diabetic retinopathy without macular edema: Secondary | ICD-10-CM | POA: Diagnosis present

## 2019-10-14 DIAGNOSIS — F17210 Nicotine dependence, cigarettes, uncomplicated: Secondary | ICD-10-CM | POA: Diagnosis present

## 2019-10-14 DIAGNOSIS — I5033 Acute on chronic diastolic (congestive) heart failure: Secondary | ICD-10-CM | POA: Diagnosis not present

## 2019-10-14 DIAGNOSIS — I43 Cardiomyopathy in diseases classified elsewhere: Secondary | ICD-10-CM | POA: Diagnosis present

## 2019-10-14 DIAGNOSIS — N1832 Chronic kidney disease, stage 3b: Secondary | ICD-10-CM

## 2019-10-14 DIAGNOSIS — Z20822 Contact with and (suspected) exposure to covid-19: Secondary | ICD-10-CM | POA: Diagnosis present

## 2019-10-14 DIAGNOSIS — Z882 Allergy status to sulfonamides status: Secondary | ICD-10-CM

## 2019-10-14 DIAGNOSIS — I5032 Chronic diastolic (congestive) heart failure: Secondary | ICD-10-CM

## 2019-10-14 DIAGNOSIS — E113299 Type 2 diabetes mellitus with mild nonproliferative diabetic retinopathy without macular edema, unspecified eye: Secondary | ICD-10-CM

## 2019-10-14 DIAGNOSIS — Z794 Long term (current) use of insulin: Secondary | ICD-10-CM

## 2019-10-14 DIAGNOSIS — Z880 Allergy status to penicillin: Secondary | ICD-10-CM

## 2019-10-14 DIAGNOSIS — E785 Hyperlipidemia, unspecified: Secondary | ICD-10-CM | POA: Diagnosis present

## 2019-10-14 DIAGNOSIS — I160 Hypertensive urgency: Secondary | ICD-10-CM | POA: Diagnosis present

## 2019-10-14 DIAGNOSIS — I119 Hypertensive heart disease without heart failure: Secondary | ICD-10-CM | POA: Diagnosis present

## 2019-10-14 DIAGNOSIS — E1122 Type 2 diabetes mellitus with diabetic chronic kidney disease: Secondary | ICD-10-CM | POA: Diagnosis present

## 2019-10-14 DIAGNOSIS — Z6841 Body Mass Index (BMI) 40.0 and over, adult: Secondary | ICD-10-CM

## 2019-10-14 DIAGNOSIS — I509 Heart failure, unspecified: Secondary | ICD-10-CM

## 2019-10-14 LAB — CBC
HCT: 38.2 % — ABNORMAL LOW (ref 39.0–52.0)
Hemoglobin: 11.7 g/dL — ABNORMAL LOW (ref 13.0–17.0)
MCH: 28.1 pg (ref 26.0–34.0)
MCHC: 30.6 g/dL (ref 30.0–36.0)
MCV: 91.6 fL (ref 80.0–100.0)
Platelets: 195 10*3/uL (ref 150–400)
RBC: 4.17 MIL/uL — ABNORMAL LOW (ref 4.22–5.81)
RDW: 15.2 % (ref 11.5–15.5)
WBC: 10.4 10*3/uL (ref 4.0–10.5)
nRBC: 0 % (ref 0.0–0.2)

## 2019-10-14 LAB — BASIC METABOLIC PANEL
Anion gap: 10 (ref 5–15)
BUN: 22 mg/dL (ref 8–23)
CO2: 26 mmol/L (ref 22–32)
Calcium: 9.1 mg/dL (ref 8.9–10.3)
Chloride: 108 mmol/L (ref 98–111)
Creatinine, Ser: 2.06 mg/dL — ABNORMAL HIGH (ref 0.61–1.24)
GFR calc Af Amer: 38 mL/min — ABNORMAL LOW (ref >60)
GFR calc non Af Amer: 33 mL/min — ABNORMAL LOW (ref >60)
Glucose, Bld: 79 mg/dL (ref 70–99)
Potassium: 3.7 mmol/L (ref 3.5–5.1)
Sodium: 144 mmol/L (ref 135–145)

## 2019-10-14 LAB — TROPONIN I (HIGH SENSITIVITY)
Troponin I (High Sensitivity): 19 ng/L — ABNORMAL HIGH (ref <18)
Troponin I (High Sensitivity): 19 ng/L — ABNORMAL HIGH (ref <18)

## 2019-10-14 LAB — BRAIN NATRIURETIC PEPTIDE: B Natriuretic Peptide: 215 pg/mL — ABNORMAL HIGH (ref 0.0–100.0)

## 2019-10-14 LAB — POC SARS CORONAVIRUS 2 AG -  ED: SARS Coronavirus 2 Ag: NEGATIVE

## 2019-10-14 LAB — CBG MONITORING, ED
Glucose-Capillary: 67 mg/dL — ABNORMAL LOW (ref 70–99)
Glucose-Capillary: 126 mg/dL — ABNORMAL HIGH (ref 70–99)
Glucose-Capillary: 91 mg/dL (ref 70–99)

## 2019-10-14 MED ORDER — FUROSEMIDE 10 MG/ML IJ SOLN
40.0000 mg | Freq: Every day | INTRAMUSCULAR | Status: DC
  Administered 2019-10-15: 40 mg via INTRAVENOUS
  Filled 2019-10-14: qty 4

## 2019-10-14 MED ORDER — INSULIN ASPART 100 UNIT/ML ~~LOC~~ SOLN
0.0000 [IU] | Freq: Three times a day (TID) | SUBCUTANEOUS | Status: DC
  Filled 2019-10-14: qty 0.2

## 2019-10-14 MED ORDER — CLONIDINE HCL 0.1 MG PO TABS
0.1000 mg | ORAL_TABLET | Freq: Once | ORAL | Status: AC
  Administered 2019-10-14: 0.1 mg via ORAL
  Filled 2019-10-14: qty 1

## 2019-10-14 MED ORDER — ACETAMINOPHEN 325 MG PO TABS
650.0000 mg | ORAL_TABLET | ORAL | Status: DC | PRN
  Administered 2019-10-15: 650 mg via ORAL
  Filled 2019-10-14: qty 2

## 2019-10-14 MED ORDER — FUROSEMIDE 10 MG/ML IJ SOLN
40.0000 mg | Freq: Once | INTRAMUSCULAR | Status: AC
  Administered 2019-10-14: 18:00:00 40 mg via INTRAVENOUS
  Filled 2019-10-14: qty 4

## 2019-10-14 MED ORDER — SODIUM CHLORIDE 0.9 % IV SOLN: 250.0000 mL | INTRAVENOUS | Status: DC | PRN

## 2019-10-14 MED ORDER — ONDANSETRON HCL 4 MG/2ML IJ SOLN: 4.0000 mg | Freq: Four times a day (QID) | INTRAMUSCULAR | Status: DC | PRN

## 2019-10-14 MED ORDER — INSULIN GLARGINE 100 UNIT/ML ~~LOC~~ SOLN
35.0000 [IU] | Freq: Two times a day (BID) | SUBCUTANEOUS | Status: DC
  Administered 2019-10-14: 35 [IU] via SUBCUTANEOUS
  Filled 2019-10-14 (×2): qty 0.35

## 2019-10-14 MED ORDER — INSULIN ASPART 100 UNIT/ML ~~LOC~~ SOLN
6.0000 [IU] | Freq: Three times a day (TID) | SUBCUTANEOUS | Status: DC
  Administered 2019-10-16 – 2019-10-17 (×4): 6 [IU] via SUBCUTANEOUS
  Filled 2019-10-14: qty 0.06

## 2019-10-14 MED ORDER — ENOXAPARIN SODIUM 40 MG/0.4ML ~~LOC~~ SOLN
40.0000 mg | Freq: Every day | SUBCUTANEOUS | Status: DC
  Administered 2019-10-14 – 2019-10-16 (×3): 40 mg via SUBCUTANEOUS
  Filled 2019-10-14 (×3): qty 0.4

## 2019-10-14 MED ORDER — SODIUM CHLORIDE 0.9% FLUSH
3.0000 mL | Freq: Two times a day (BID) | INTRAVENOUS | Status: DC
  Administered 2019-10-14 – 2019-10-17 (×6): 3 mL via INTRAVENOUS

## 2019-10-14 MED ORDER — SODIUM CHLORIDE 0.9% FLUSH: 3.0000 mL | INTRAVENOUS | Status: DC | PRN

## 2019-10-14 MED ORDER — SODIUM CHLORIDE 0.9% FLUSH
3.0000 mL | Freq: Once | INTRAVENOUS | Status: AC
  Administered 2019-10-14: 3 mL via INTRAVENOUS

## 2019-10-14 NOTE — H&P (Signed)
History and Physical    Derek Ibarra [id number] DOB: 21-Apr-1956 DOA: 10/14/2019  PCP: [facility] [place]  Patient coming from: Home  I have personally briefly reviewed patient's old medical records in [place]  Chief Complaint: SOB  HPI: Derek Ibarra is a 64 y.o. male with medical history significant of DM2, HTN, OSA.  Patient presents to ED with c/o SOB and chest pressure.  Onset 24h ago, persistent.  Significant DOE.  No fever, no abd pain, no N/V.  Does have chronic BLE edema that is unchanged from baseline.  Has h/o HTN, and hypertensive cardiomyopathy with similar episode a couple years ago that was pulmonary edema.  No known COVID exposures.   ED Course: CXR with bibasilar opacity.  Trop 19x2.  Creat of 2.0 unchanged from 04/2019.  BP running [id number] systolic, given 0.1mg  clonidine and 40mg  IV lasix in ED.  New O2 requirement of 2L (satting low 80s on RA).   Review of Systems: As per HPI, otherwise all review of systems negative.  Past Medical History:  Diagnosis Date  . G6PD deficiency   . Hypertension   . Hypertensive heart disease   . Morbid obesity ([place])   . OSA on CPAP    16 CM pressure  . Sleep apnea     Past Surgical History:  Procedure Laterality Date  . KNEE SURGERY       reports that he has been smoking cigarettes. He has a 30.00 pack-year smoking history. He has never used smokeless tobacco. He reports that he does not drink alcohol or use drugs.  Allergies  Allergen Reactions  . Penicillins Rash  . Sulfa Antibiotics Rash    History reviewed. No pertinent family history. No sick contacts reported.  Prior to Admission medications   Medication Sig Start Date End Date Taking? Authorizing Provider  albuterol (PROVENTIL HFA;VENTOLIN HFA) 108 (90 BASE) MCG/ACT inhaler Inhale into the lungs every 6 (six) hours as needed for wheezing or shortness of breath.    [provider]  allopurinol (ZYLOPRIM) 100 MG tablet Take 200 mg by mouth  daily.    [provider]  carvedilol (COREG) 25 MG tablet Take 25 mg by mouth 2 (two) times daily with a meal.    [provider]  cefpodoxime (VANTIN) 200 MG tablet Take 1 tablet (200 mg total) by mouth every 12 (twelve) hours. For 4 more days. Patient not taking: Reported on 03/03/2018 10/29/14   Bonnielee Haff, MD  cloNIDine (CATAPRES) 0.1 MG tablet Take 0.1 mg by mouth 2 (two) times daily.    [provider]  ferrous sulfate 325 (65 FE) MG tablet Take 325 mg by mouth daily with breakfast.    [provider]  furosemide (LASIX) 20 MG tablet Take 2 tablets (40 mg total) by mouth daily. Patient not taking: Reported on 03/03/2018 10/29/14   Bonnielee Haff, MD  hydrALAZINE (APRESOLINE) 100 MG tablet Take 100 mg by mouth 3 (three) times daily.    [provider]  insulin glargine (LANTUS) 100 UNIT/ML injection Inject 40 Units into the skin 2 (two) times daily.    [provider]  lisinopril (PRINIVIL,ZESTRIL) 40 MG tablet Take 40 mg by mouth daily.    [provider]  metFORMIN (GLUCOPHAGE-XR) 500 MG 24 hr tablet Take 500 mg by mouth daily with breakfast.    [provider]  Multiple Vitamin (MULTIVITAMIN WITH MINERALS) TABS tablet Take 1 tablet by mouth daily.    [provider]  oxyCODONE-acetaminophen (  PERCOCET/ROXICET) 5-325 MG tablet Take 1 tablet by mouth every 4 (four) hours as needed for severe pain. 05/09/19   McDonald, Mia A, PA-C  potassium chloride SA (K-DUR,KLOR-CON) 20 MEQ tablet Take 20 mEq by mouth daily.    [provider]  predniSONE (DELTASONE) 20 MG tablet Take 3 tablets once daily for 2 days, then take 2 tablets once daily for 3 days, then take 1 tablet once daily for 3 days and then stop. Patient not taking: Reported on 03/03/2018 10/29/14   Bonnielee Haff, MD  rosuvastatin (CRESTOR) 10 MG tablet Take 10 mg by mouth daily.    [provider]    Physical Exam: Vitals:   10/14/19  1830 10/14/19 1845 10/14/19 1900 10/14/19 2000  BP: (!) 180/71  (!) 159/77 (!) 171/73  Pulse: 91 90 84 84  Resp: 17 (!) 22 17 18   Temp:      TempSrc:      SpO2: 100% 98% 98% 98%    Constitutional: NAD, calm, comfortable Eyes: PERRL, lids and conjunctivae normal ENMT: Mucous membranes are moist. Posterior pharynx clear of any exudate or lesions.Normal dentition.  Neck: normal, supple, no masses, no thyromegaly Respiratory: clear to auscultation bilaterally, no wheezing, no crackles. Normal respiratory effort. No accessory muscle use. Pursed lip breathing. Cardiovascular: Regular rate and rhythm, no murmurs / rubs / gallops. BLE edema present. 2+ pedal pulses. No carotid bruits.  Abdomen: no tenderness, no masses palpated. No hepatosplenomegaly. Bowel sounds positive.  Musculoskeletal: no clubbing / cyanosis. No joint deformity upper and lower extremities. Good ROM, no contractures. Normal muscle tone.  Skin: no rashes, lesions, ulcers. No induration Neurologic: CN 2-12 grossly intact. Sensation intact, DTR normal. Strength 5/5 in all 4.  Psychiatric: Normal judgment and insight. Alert and oriented x 3. Normal mood.    Labs on Admission: I have personally reviewed following labs and imaging studies  CBC: Recent Labs  Lab 10/14/19 1414  WBC 10.4  HGB 11.7*  HCT 38.2*  MCV 91.6  PLT 0000000   Basic Metabolic Panel: Recent Labs  Lab 10/14/19 1414  NA 144  K 3.7  CL 108  CO2 26  GLUCOSE 79  BUN 22  CREATININE 2.06*  CALCIUM 9.1   GFR: CrCl cannot be calculated (Unknown ideal weight.). Liver Function Tests: No results for input(s): AST, ALT, ALKPHOS, BILITOT, PROT, ALBUMIN in the last 168 hours. No results for input(s): LIPASE, AMYLASE in the last 168 hours. No results for input(s): AMMONIA in the last 168 hours. Coagulation Profile: No results for input(s): INR, PROTIME in the last 168 hours. Cardiac Enzymes: No results for input(s): CKTOTAL, CKMB, CKMBINDEX, TROPONINI  in the last 168 hours. BNP (last 3 results) No results for input(s): PROBNP in the last 8760 hours. HbA1C: No results for input(s): HGBA1C in the last 72 hours. CBG: Recent Labs  Lab 10/14/19 1729 10/14/19 2006  GLUCAP 67* 126*   Lipid Profile: No results for input(s): CHOL, HDL, LDLCALC, TRIG, CHOLHDL, LDLDIRECT in the last 72 hours. Thyroid Function Tests: No results for input(s): TSH, T4TOTAL, FREET4, T3FREE, THYROIDAB in the last 72 hours. Anemia Panel: No results for input(s): VITAMINB12, FOLATE, FERRITIN, TIBC, IRON, RETICCTPCT in the last 72 hours. Urine analysis: No results found for: COLORURINE, APPEARANCEUR, [place], [place], GLUCOSEU, [place], BILIRUBINUR, KETONESUR, PROTEINUR, UROBILINOGEN, NITRITE, LEUKOCYTESUR  Radiological Exams on Admission: DG Chest 2 View  Result Date: 10/14/2019 CLINICAL DATA:  Chest pain EXAM: CHEST - 2 VIEW COMPARISON:  05/08/2019 FINDINGS: Mild cardiomegaly. Unchanged bandlike scarring  of the left midlung. Subtle heterogeneous airspace opacity of the bilateral lung bases. Disc degenerative disease of the thoracic spine. IMPRESSION: 1. Subtle heterogeneous airspace opacity at the bilateral lung bases, suspicious for infection. 2. Stable bandlike scarring in the left midlung. 3. Cardiomegaly. Electronically Signed   By: Eddie Candle M.D.   On: 10/14/2019 13:54    EKG: Independently reviewed.  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 Acute resp failure with hypoxia - 1. DDx includes pulm edema from acute on chronic CHF, COVID, ACS, and other causes. 2. COVID pending 3. Treating empirically as CHF for the moment as this seems most likely at the moment. 4. Cont pulse ox 2. Suspected acute on chronic diastolic CHF - 1. CHF pathway 2. Lasix 40mg  IV  daily first dose in ED now 3. 2d echo 4. Strict intake and output 5. Daily BMP 6. Tele monitor 3. HTN - 1. Resume home BP meds once med rec completed 4. DM2 - 1. Pt takes long acting U500: 70 in AM and 40 in PM he says. 2. Will put on Lantus 35u bid for the moment 3. 6u mealtime novolog 4. And resistant SSI AC 5. OSA - 1. Re-order CPAP if / when COVID comes back negative  DVT prophylaxis: Lovenox Code Status: Full Family Communication: No family in room Disposition Plan: Home after admit Consults called: None Admission status: Place in 10     Dajae Kizer, [facility]  How to contact the [facility] Attending or Consulting provider [place] or covering provider during after hours [place], for this patient?  1. Check the care team in [facility] and look for a) attending/consulting TR[facility] provider listed and b) the [facility] team listed 2. Log into [email or web address]  Amion Physician Scheduling and messaging for groups and whole hospitals  On call and physician scheduling software for group practices, residents, hospitalists and other medical providers for call, clinic, rotation and shift schedules. OnCall [employer] is a hospital-wide system for scheduling doctors and paging doctors on call. EasyPlot is for scientific plotting and data analysis.  [email or web address]  and use [facility] universal password to access. If you do not have the password, please contact the hospital operator.  3. Locate the [facility] provider you are looking for under [facility] and page to a number that you can be directly reached. 4. If you still have difficulty reaching the provider, please page the [facility] (Director on Call) for the Hospitalists listed on amion for assistance.  10/14/2019, 8:34 PM

## 2019-10-14 NOTE — ED Notes (Signed)
Save blue tube in main lab °

## 2019-10-14 NOTE — ED Provider Notes (Signed)
[facility] DEPT Provider Note   CSN: [id number] Arrival date & time: 10/14/19  1309     History Chief Complaint  Patient presents with  . Chest Pain    Genero Summersett is a 64 y.o. male.  The history is provided by the patient and medical records. No language interpreter was used.  Chest Pain  Arvey Lader is a 64 y.o. male who presents to the Emergency Department complaining of shortness of breath and chest pressure. He presents the emergency department for sudden onset chest pressure and shortness of breath that began about 24 hours ago. He has significant dyspnea on exertion. He denies any fever, abdominal pain, nausea, vomiting. He does have chronic lower extremity edema in this is unchanged from baseline. He has a history of hypertension, diabetes and is compliant with all of his medications. He states that he had a similar episode several years ago and was diagnosed with pulmonary edema at that time. No known COVID 19 exposures. No history of blood clots.    Past Medical History:  Diagnosis Date  . G6PD deficiency   . Hypertension   . Hypertensive heart disease   . Morbid obesity ([place])   . OSA on CPAP    16 CM pressure  . Sleep apnea     Patient Active Problem List   Diagnosis Date Noted  . Chronic diastolic heart failure ([place]) 10/29/2014  . Pulmonary edema 10/27/2014  . G6PD deficiency 10/27/2014  . Acute respiratory failure with hypoxemia ([place]) 10/27/2014  . Hypertensive heart disease 10/27/2014  . Smoker 10/27/2014  . Elevated troponin 10/27/2014  . Morbid obesity ([place]) 10/27/2014  . 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 Sleep apnea 10/27/2014    Past Surgical History:  Procedure Laterality Date  . KNEE SURGERY         History reviewed. No pertinent family history.  Social History   Tobacco Use  . Smoking status: Current Every Day Smoker    Packs/day: 1.00    Years: 30.00    Pack years:  30.00    Types: Cigarettes  . Smokeless tobacco: Never Used  Substance Use Topics  . Alcohol use: No    Alcohol/week: 0.0 standard drinks  . Drug use: No    Home Medications Prior to Admission medications   Medication Sig Start Date End Date Taking? Authorizing Provider  albuterol (PROVENTIL HFA;VENTOLIN HFA) 108 (90 BASE) MCG/ACT inhaler Inhale into the lungs every 6 (six) hours as needed for wheezing or shortness of breath.    [provider]  allopurinol (ZYLOPRIM) 100 MG tablet Take 200 mg by mouth daily.    [provider]  carvedilol (COREG) 25 MG tablet Take 25 mg by mouth 2 (two) times daily with a meal.    [provider]  cefpodoxime (VANTIN) 200 MG tablet Take 1 tablet (200 mg total) by mouth every 12 (twelve) hours. For 4 more days. Patient not taking: Reported on 03/03/2018 10/29/14   Bonnielee Haff, MD  cloNIDine (CATAPRES) 0.1 MG tablet Take 0.1 mg by mouth 2 (two) times daily.    [provider]  ferrous sulfate 325 (65 FE) MG tablet Take 325 mg by mouth daily with breakfast.    [provider]  furosemide (LASIX) 20 MG tablet Take 2 tablets (40 mg total) by mouth daily. Patient not taking: Reported on 03/03/2018 10/29/14   Bonnielee Haff, MD  hydrALAZINE (APRESOLINE) 100 MG tablet Take 100 mg by mouth 3 (  three) times daily.    [provider]  insulin glargine (LANTUS) 100 UNIT/ML injection Inject 40 Units into the skin 2 (two) times daily.    [provider]  lisinopril (PRINIVIL,ZESTRIL) 40 MG tablet Take 40 mg by mouth daily.    [provider]  metFORMIN (GLUCOPHAGE-XR) 500 MG 24 hr tablet Take 500 mg by mouth daily with breakfast.    [provider]  Multiple Vitamin (MULTIVITAMIN WITH MINERALS) TABS tablet Take 1 tablet by mouth daily.    [provider]  oxyCODONE-acetaminophen (PERCOCET/ROXICET) 5-325 MG tablet Take 1 tablet by mouth every 4 (four) hours as needed for severe  pain. 05/09/19   McDonald, Mia A, PA-C  potassium chloride SA (K-DUR,KLOR-CON) 20 MEQ tablet Take 20 mEq by mouth daily.    [provider]  predniSONE (DELTASONE) 20 MG tablet Take 3 tablets once daily for 2 days, then take 2 tablets once daily for 3 days, then take 1 tablet once daily for 3 days and then stop. Patient not taking: Reported on 03/03/2018 10/29/14   Bonnielee Haff, MD  rosuvastatin (CRESTOR) 10 MG tablet Take 10 mg by mouth daily.    [provider]    Allergies    Penicillins and Sulfa antibiotics  Review of Systems   Review of Systems  Cardiovascular: Positive for chest pain.  All other systems reviewed and are negative.   Physical Exam Updated Vital Signs BP (!) 164/78 (BP Location: Left Arm)   Pulse 89   Temp 98.4 F (36.9 C) (Oral)   Resp (!) 22   SpO2 95%   Physical Exam Vitals and nursing note reviewed.  Constitutional:      Appearance: He is well-developed.  HENT:     Head: Normocephalic and atraumatic.  Cardiovascular:     Rate and Rhythm: Normal rate and regular rhythm.     Heart sounds: No murmur.  Pulmonary:     Effort: No respiratory distress.     Breath sounds: Normal breath sounds.     Comments: Tachypnea  Abdominal:     Palpations: Abdomen is soft.     Tenderness: There is no abdominal tenderness. There is no guarding or rebound.  Musculoskeletal:        General: No tenderness.     Comments: 2+ pitting edema to BLE  Skin:    General: Skin is warm and dry.  Neurological:     Mental Status: He is alert and oriented to person, place, and time.  Psychiatric:        Behavior: Behavior normal.     ED Results / Procedures / Treatments   Labs (all labs ordered are listed, but only abnormal results are displayed) Labs Reviewed  BASIC METABOLIC PANEL - Abnormal; Notable for the following components:      Result Value   Creatinine, Ser 2.06 (*)    GFR calc non Af Amer 33 (*)    GFR calc Af Amer 38 (*)    All other  components within normal limits  CBC - Abnormal; Notable for the following components:   RBC 4.17 (*)    Hemoglobin 11.7 (*)    HCT 38.2 (*)    All other components within normal limits  CBG MONITORING, ED - Abnormal; Notable for the following components:   Glucose-Capillary 67 (*)    All other components within normal limits  TROPONIN I (HIGH SENSITIVITY) - Abnormal; Notable for the following components:   Troponin I (High Sensitivity) 19 (*)  All other components within normal limits  TROPONIN I (HIGH SENSITIVITY)    EKG EKG Interpretation  Date/Time:  Saturday October 14 2019 13:23:54 EST Ventricular Rate:  91 PR Interval:    QRS Duration: 109 QT Interval:  387 QTC Calculation: 477 R Axis:   -47 Text Interpretation: Sinus rhythm LAD, consider left anterior fascicular block RSR' in V1 or V2, right VCD or RVH Abnormal T, consider ischemia, lateral leads Since last tracing QT has shortened Otherwise no significant change Confirmed by Daleen Bo [phone]) on 10/14/2019 1:39:16 PM   Radiology DG Chest 2 View  Result Date: 10/14/2019 CLINICAL DATA:  Chest pain EXAM: CHEST - 2 VIEW COMPARISON:  05/08/2019 FINDINGS: Mild cardiomegaly. Unchanged bandlike scarring of the left midlung. Subtle heterogeneous airspace opacity of the bilateral lung bases. Disc degenerative disease of the thoracic spine. IMPRESSION: 1. Subtle heterogeneous airspace opacity at the bilateral lung bases, suspicious for infection. 2. Stable bandlike scarring in the left midlung. 3. Cardiomegaly. Electronically Signed   By: Eddie Candle M.D.   On: 10/14/2019 13:54    Procedures Procedures (including critical care time)  Medications Ordered in ED Medications  sodium chloride flush (NS) 0.9 % injection 3 mL (has no administration in time range)  cloNIDine (CATAPRES) tablet 0.1 mg (has no administration in time range)  furosemide (LASIX) injection 40 mg (has no administration in time range)    ED Course  I  have reviewed the triage vital signs and the nursing notes.  Pertinent labs & imaging results that were available during my care of the patient were reviewed by me and considered in my medical decision making (see chart for details).    MDM Rules/Calculators/A&P                     Pt with hx/o pulmonary edema here for evaluation of increased shortness of breath similar to prior episodes of pulmonary edema. Chest x-ray with multifocal infiltrates. Current presentation is not consistent with bacterial pneumonia. He was treated with his home blood pressure medication, Lasix for diuresis for possible recurrent pulmonary edema. Labs demonstrate stable renal insufficiency. Patient did develop hypoxia during his ED stay and required supplemental oxygen with 2 L. Patient updated findings of studies recommendation for admission and he is in agreement treatment plan. Hospitalist consulted for admission.  Devang Seccombe was evaluated in Emergency Department on 10/14/2019 for the symptoms described in the history of present illness. He was evaluated in the context of the global COVID-19 pandemic, which necessitated consideration that the patient might be at risk for infection with the SARS-CoV-2 virus that causes COVID-19. Institutional protocols and algorithms that pertain to the evaluation of patients at risk for COVID-19 are in a state of rapid change based on information released by regulatory bodies including the CDC and federal and state organizations. These policies and algorithms were followed during the patient's care in the ED.   Final Clinical Impression(s) / ED Diagnoses Final diagnoses:  Acute respiratory failure with hypoxia [facility])    Rx / DC Orders ED Discharge Orders    None       Quintella Reichert, MD 10/14/19 2353

## 2019-10-14 NOTE — ED Notes (Signed)
Meal offered to patient as he states that he has not had anything to eat since this AM. Patient declined. Bedtime snack provided (graham crackers and peanut butter). Patient provided warm blanket and urinal emptied. Patient denies any additional needs at this time. Call bell within reach and bed in lowest position.

## 2019-10-14 NOTE — ED Triage Notes (Signed)
Pt presents with c/o chest pain. When asked about the pain, pt reports it feels more like "pulmonary edema". Pt denies hx of CHF but reports he has had some fluid on his lungs before.

## 2019-10-15 ENCOUNTER — Observation Stay (HOSPITAL_COMMUNITY): Payer: Medicare Other

## 2019-10-15 DIAGNOSIS — F17210 Nicotine dependence, cigarettes, uncomplicated: Secondary | ICD-10-CM | POA: Diagnosis present

## 2019-10-15 DIAGNOSIS — N1832 Chronic kidney disease, stage 3b: Secondary | ICD-10-CM | POA: Diagnosis present

## 2019-10-15 DIAGNOSIS — Z882 Allergy status to sulfonamides status: Secondary | ICD-10-CM | POA: Diagnosis not present

## 2019-10-15 DIAGNOSIS — Z880 Allergy status to penicillin: Secondary | ICD-10-CM | POA: Diagnosis not present

## 2019-10-15 DIAGNOSIS — I160 Hypertensive urgency: Secondary | ICD-10-CM | POA: Diagnosis present

## 2019-10-15 DIAGNOSIS — E876 Hypokalemia: Secondary | ICD-10-CM | POA: Diagnosis not present

## 2019-10-15 DIAGNOSIS — J9601 Acute respiratory failure with hypoxia: Secondary | ICD-10-CM | POA: Diagnosis present

## 2019-10-15 DIAGNOSIS — Z6841 Body Mass Index (BMI) 40.0 and over, adult: Secondary | ICD-10-CM | POA: Diagnosis not present

## 2019-10-15 DIAGNOSIS — E1122 Type 2 diabetes mellitus with diabetic chronic kidney disease: Secondary | ICD-10-CM | POA: Diagnosis present

## 2019-10-15 DIAGNOSIS — Z79899 Other long term (current) drug therapy: Secondary | ICD-10-CM | POA: Diagnosis not present

## 2019-10-15 DIAGNOSIS — I43 Cardiomyopathy in diseases classified elsewhere: Secondary | ICD-10-CM | POA: Diagnosis present

## 2019-10-15 DIAGNOSIS — I130 Hypertensive heart and chronic kidney disease with heart failure and stage 1 through stage 4 chronic kidney disease, or unspecified chronic kidney disease: Secondary | ICD-10-CM | POA: Diagnosis present

## 2019-10-15 DIAGNOSIS — Z794 Long term (current) use of insulin: Secondary | ICD-10-CM | POA: Diagnosis not present

## 2019-10-15 DIAGNOSIS — Z20822 Contact with and (suspected) exposure to covid-19: Secondary | ICD-10-CM | POA: Diagnosis present

## 2019-10-15 DIAGNOSIS — I509 Heart failure, unspecified: Secondary | ICD-10-CM

## 2019-10-15 DIAGNOSIS — I5031 Acute diastolic (congestive) heart failure: Secondary | ICD-10-CM

## 2019-10-15 DIAGNOSIS — E785 Hyperlipidemia, unspecified: Secondary | ICD-10-CM | POA: Diagnosis present

## 2019-10-15 DIAGNOSIS — I5033 Acute on chronic diastolic (congestive) heart failure: Secondary | ICD-10-CM | POA: Diagnosis present

## 2019-10-15 DIAGNOSIS — E11319 Type 2 diabetes mellitus with unspecified diabetic retinopathy without macular edema: Secondary | ICD-10-CM | POA: Diagnosis present

## 2019-10-15 DIAGNOSIS — G4733 Obstructive sleep apnea (adult) (pediatric): Secondary | ICD-10-CM | POA: Diagnosis present

## 2019-10-15 LAB — BASIC METABOLIC PANEL
Anion gap: 11 (ref 5–15)
BUN: 22 mg/dL (ref 8–23)
CO2: 27 mmol/L (ref 22–32)
Calcium: 9.1 mg/dL (ref 8.9–10.3)
Chloride: 104 mmol/L (ref 98–111)
Creatinine, Ser: 1.85 mg/dL — ABNORMAL HIGH (ref 0.61–1.24)
GFR calc Af Amer: 43 mL/min — ABNORMAL LOW (ref >60)
GFR calc non Af Amer: 37 mL/min — ABNORMAL LOW (ref >60)
Glucose, Bld: 135 mg/dL — ABNORMAL HIGH (ref 70–99)
Potassium: 3.5 mmol/L (ref 3.5–5.1)
Sodium: 142 mmol/L (ref 135–145)

## 2019-10-15 LAB — HEMOGLOBIN A1C
Hgb A1c MFr Bld: 5.7 % — ABNORMAL HIGH (ref 4.8–5.6)
Mean Plasma Glucose: 116.89 mg/dL

## 2019-10-15 LAB — GLUCOSE, CAPILLARY
Glucose-Capillary: 149 mg/dL — ABNORMAL HIGH (ref 70–99)
Glucose-Capillary: 147 mg/dL — ABNORMAL HIGH (ref 70–99)

## 2019-10-15 LAB — HIV ANTIBODY (ROUTINE TESTING W REFLEX): HIV Screen 4th Generation wRfx: NONREACTIVE

## 2019-10-15 LAB — ECHOCARDIOGRAM COMPLETE

## 2019-10-15 LAB — CBG MONITORING, ED
Glucose-Capillary: 145 mg/dL — ABNORMAL HIGH (ref 70–99)
Glucose-Capillary: 202 mg/dL — ABNORMAL HIGH (ref 70–99)
Glucose-Capillary: 202 mg/dL — ABNORMAL HIGH (ref 70–99)

## 2019-10-15 LAB — SARS CORONAVIRUS 2 (TAT 6-24 HRS): SARS Coronavirus 2: NEGATIVE

## 2019-10-15 MED ORDER — INSULIN GLARGINE 100 UNIT/ML ~~LOC~~ SOLN
20.0000 [IU] | Freq: Two times a day (BID) | SUBCUTANEOUS | Status: DC
  Administered 2019-10-15 – 2019-10-17 (×5): 20 [IU] via SUBCUTANEOUS
  Filled 2019-10-15 (×6): qty 0.2

## 2019-10-15 MED ORDER — CLONIDINE HCL 0.1 MG PO TABS: 0.1000 mg | ORAL_TABLET | Freq: Two times a day (BID) | ORAL | Status: DC

## 2019-10-15 MED ORDER — INSULIN ASPART 100 UNIT/ML ~~LOC~~ SOLN
0.0000 [IU] | Freq: Three times a day (TID) | SUBCUTANEOUS | Status: DC
  Administered 2019-10-15: 3 [IU] via SUBCUTANEOUS
  Administered 2019-10-15 – 2019-10-16 (×3): 1 [IU] via SUBCUTANEOUS
  Administered 2019-10-16: 2 [IU] via SUBCUTANEOUS
  Administered 2019-10-17: 1 [IU] via SUBCUTANEOUS
  Filled 2019-10-15: qty 0.09

## 2019-10-15 MED ORDER — HYDRALAZINE HCL 50 MG PO TABS
100.0000 mg | ORAL_TABLET | Freq: Three times a day (TID) | ORAL | Status: DC
  Administered 2019-10-15 – 2019-10-17 (×8): 100 mg via ORAL
  Filled 2019-10-15 (×10): qty 2

## 2019-10-15 MED ORDER — CLONIDINE HCL 0.1 MG PO TABS
0.1000 mg | ORAL_TABLET | Freq: Once | ORAL | Status: AC
  Administered 2019-10-15: 0.1 mg via ORAL
  Filled 2019-10-15: qty 1

## 2019-10-15 MED ORDER — CARVEDILOL 25 MG PO TABS
25.0000 mg | ORAL_TABLET | Freq: Two times a day (BID) | ORAL | Status: DC
  Administered 2019-10-15 – 2019-10-17 (×6): 25 mg via ORAL
  Filled 2019-10-15 (×7): qty 1

## 2019-10-15 MED ORDER — HYDRALAZINE HCL 20 MG/ML IJ SOLN: 10.0000 mg | INTRAMUSCULAR | Status: DC | PRN

## 2019-10-15 MED ORDER — CLONIDINE HCL 0.2 MG PO TABS
0.2000 mg | ORAL_TABLET | Freq: Two times a day (BID) | ORAL | Status: DC
  Administered 2019-10-15 – 2019-10-17 (×5): 0.2 mg via ORAL
  Filled 2019-10-15 (×4): qty 1
  Filled 2019-10-15: qty 2

## 2019-10-15 MED ORDER — PERFLUTREN LIPID MICROSPHERE
1.0000 mL | INTRAVENOUS | Status: AC | PRN
  Administered 2019-10-15: 2 mL via INTRAVENOUS
  Filled 2019-10-15: qty 10

## 2019-10-15 MED ORDER — INSULIN ASPART 100 UNIT/ML ~~LOC~~ SOLN
0.0000 [IU] | Freq: Every day | SUBCUTANEOUS | Status: DC
  Filled 2019-10-15: qty 0.05

## 2019-10-15 NOTE — Progress Notes (Signed)
PROGRESS NOTE  Derek Ibarra  DOB: [birth year]  PCP: [facility] [place] [id number]  DOA: 10/14/2019 Admitted From: Home  LOS: 0 days   Chief Complaint  Patient presents with  . Chest Pain   Brief narrative: Derek Ibarra is a 64 y.o. male with PMH of HTN, T2DM, OSA, chronic diastolic CHF, chronic bilateral lower extremity edema on Lasix. Patient presented to the ED on 10/14/2019 with complaint of progressively worsening shortness of breath, chest pressure for 24-hour duration. Patient denies having history of congestive heart failure but states that he had an episode of similar symptoms in the past and was found to have pulmonary edema. In the ED, blood pressure was elevated to 180s.  He was hypoxic and required supplemental oxygen, 2 L/min. Chest x-ray showed bibasilar opacities. COVID-19 PCR negative. Patient was admitted under hospitalist service for further evaluation and management.  Subjective: Patient was seen and examined this morning.  Morbidly obese African-American male.  Propped up in bed.  On supplemental oxygen.  Feels better than at presentation.  Assessment/Plan: Acute on chronic diastolic CHF Hypertensive urgency -Presented with progressively worsening shortness of breath/chest pressure for 24-hour duration. -Patient has blood pressure elevated up to 180s. -I suspect flash/rapidly progressive pulmonary edema due to blood pressure surge in the background of chronic diastolic CHF. -Echocardiogram with EF 60 to 123456, grade 2 diastolic dysfunction. -Home meds include Coreg, clonidine, hydralazine, Lasix 40 mg daily. -Currently on Coreg, clonidine, hydralazine as well as Lasix IV 40 mg daily. -Monitor blood pressure trend.  Maintain strict intake and output. -Monitor renal function and electrolytes.  Type 2 diabetes mellitus -Patient states that he takes long-acting U5 100, 70 units in a.m. and 40 units in p.m. -Diabetes coordinator consulted.  -Currently on Lantus  20 units twice daily as well as premeal insulin and sliding scale with Accu-Cheks -Keep Metformin on hold.  Hyperlipidemia -Continue Crestor.  CKD stage IIIb -Baseline creatinine 2.28 from July 2020.  Presented with creatinine of 2.06.  -Continue to monitor.  Morbid obesity - Body mass index is 43.12 kg/m. Patient has been advised to make an attempt to improve diet and exercise patterns to aid in weight loss.  OSA -Continue CPAP.  Body mass index is 43.12 kg/m. Mobility: Encourage ambulation Diet: Cardiac/diabetic diet Fluid: Not on IV fluid DVT prophylaxis:  Lovenox subcu Code Status:  Full code Family Communication:  Not at bedside Expected Discharge:  Anticipate discharge home in 1 to 2 days  Consultants:  None  Procedures:  None  Antimicrobials: Anti-infectives (From admission, onward)   None        Code Status: Full Code   Diet Order            Diet heart healthy/carb modified Room service appropriate? Yes; Fluid consistency: Thin  Diet effective now              Infusions:  . sodium chloride      Scheduled Meds: . carvedilol  25 mg Oral BID WC  . cloNIDine  0.2 mg Oral BID  . enoxaparin (LOVENOX) injection  40 mg Subcutaneous QHS  . furosemide  40 mg Intravenous Daily  . hydrALAZINE  100 mg Oral Q8H  . insulin aspart  0-5 Units Subcutaneous QHS  . insulin aspart  0-9 Units Subcutaneous TID WC  . insulin aspart  6 Units Subcutaneous TID WC  . insulin glargine  20 Units Subcutaneous BID  . sodium chloride flush  3 mL Intravenous Q12H    PRN  meds: sodium chloride, acetaminophen, hydrALAZINE, ondansetron (ZOFRAN) IV, sodium chloride flush   Objective: Vitals:   10/15/19 1400 10/15/19 1458  BP: 140/63 (!) 164/78  Pulse: 88 91  Resp: 16 20  Temp:  99.7 F (37.6 C)  SpO2: 98% 95%    Intake/Output Summary (Last 24 hours) at 10/15/2019 1716 Last data filed at 10/15/2019 0121 Gross per 24 hour  Intake --  Output 1300 ml  Net -1300 ml    Filed Weights   10/15/19 1458  Weight: 132.5 kg   Weight change:  Body mass index is 43.12 kg/m.   Physical Exam: General exam: Appears calm and comfortable.  Morbidly obese. Skin: No rashes, lesions or ulcers. HEENT: Atraumatic, normocephalic, supple neck, no obvious bleeding Lungs: Clear to auscultation bilaterally CVS: regular rate and rhythm GI/Abd soft, nt, nd, bs+ CNS: Alert, awake monitor x3 Psychiatry: Mood appropriate Extremities: No pedal edema, no calf tenderness  Data Review: I have personally reviewed the laboratory data and studies available.  Recent Labs  Lab 10/14/19 1414  WBC 10.4  HGB 11.7*  HCT 38.2*  MCV 91.6  PLT 195   . Recent Labs  Lab 10/14/19 1414 10/15/19 0604  NA 144 142  K 3.7 3.5  CL 108 104  CO2 26 27  GLUCOSE 79 135*  BUN 22 22  CREATININE 2.06* 1.85*  CALCIUM 9.1 9.1    Terrilee Croak, MD  [facility] 10/15/2019

## 2019-10-15 NOTE — Progress Notes (Signed)
Inpatient Diabetes Program Recommendations  AACE/ADA: New Consensus Statement on Inpatient Glycemic Control (2015)  Target Ranges:  Prepandial:   less than 140 mg/dL      Peak postprandial:   less than 180 mg/dL (1-2 hours)      Critically ill patients:  140 - 180 mg/dL   Lab Results  Component Value Date   GLUCAP 145 (H) 10/15/2019   HGBA1C 6.6 (H) 10/27/2014    Review of Glycemic Control  Diabetes history: DM2 Outpatient Diabetes medications: Lantus 40 units bid + Metformin 500 mg Current orders for Inpatient glycemic control: Lantus 35 units bid + Novolog 6 units tid meal coverage if eats 50% + Novolog correction scale resistant tid + hs 0-5         Inpatient Diabetes Program Recommendations:   -Decrease Lantus to 20 units bid (50% home insulin dose) -Decrease Novolog correction to sensitive 0-9 tid + hs 0-5 Secure chat sent to Dr. Pietro Cassis and RN Levander Campion  Thank you, Nani Gasser. Robyne Matar, RN, MSN, CDE  Diabetes Coordinator Inpatient Glycemic Control Team Team Pager [phone] (8am-5pm) 10/15/2019 8:47 AM

## 2019-10-15 NOTE — ED Notes (Signed)
RN has spoken with Diabetes Coordinator. Diabetes Coordinator will call RN back at [phone].

## 2019-10-15 NOTE — ED Notes (Signed)
Patient provided breakfast tray

## 2019-10-15 NOTE — ED Notes (Signed)
Admitting provider Alcario Drought paged for blood pressure medication orders.

## 2019-10-15 NOTE — ED Notes (Signed)
Nurse Secretary has paged Diabetes Coordinator.

## 2019-10-15 NOTE — Progress Notes (Signed)
  Echocardiogram 2D Echocardiogram has been performed.  Derek Ibarra 10/15/2019, 8:42 AM

## 2019-10-15 NOTE — ED Notes (Signed)
Cardiac Ultrasound at bedside with patient at this time.

## 2019-10-16 ENCOUNTER — Inpatient Hospital Stay (HOSPITAL_COMMUNITY): Payer: Medicare Other

## 2019-10-16 LAB — GLUCOSE, CAPILLARY
Glucose-Capillary: 129 mg/dL — ABNORMAL HIGH (ref 70–99)
Glucose-Capillary: 171 mg/dL — ABNORMAL HIGH (ref 70–99)
Glucose-Capillary: 123 mg/dL — ABNORMAL HIGH (ref 70–99)
Glucose-Capillary: 162 mg/dL — ABNORMAL HIGH (ref 70–99)

## 2019-10-16 LAB — BASIC METABOLIC PANEL
Anion gap: 10 (ref 5–15)
BUN: 30 mg/dL — ABNORMAL HIGH (ref 8–23)
CO2: 27 mmol/L (ref 22–32)
Calcium: 8.8 mg/dL — ABNORMAL LOW (ref 8.9–10.3)
Chloride: 104 mmol/L (ref 98–111)
Creatinine, Ser: 2.27 mg/dL — ABNORMAL HIGH (ref 0.61–1.24)
GFR calc Af Amer: 34 mL/min — ABNORMAL LOW (ref >60)
GFR calc non Af Amer: 29 mL/min — ABNORMAL LOW (ref >60)
Glucose, Bld: 135 mg/dL — ABNORMAL HIGH (ref 70–99)
Potassium: 3.3 mmol/L — ABNORMAL LOW (ref 3.5–5.1)
Sodium: 141 mmol/L (ref 135–145)

## 2019-10-16 MED ORDER — POTASSIUM CHLORIDE CRYS ER 20 MEQ PO TBCR
20.0000 meq | EXTENDED_RELEASE_TABLET | Freq: Once | ORAL | Status: AC
  Administered 2019-10-16: 20 meq via ORAL
  Filled 2019-10-16: qty 1

## 2019-10-16 MED ORDER — FUROSEMIDE 10 MG/ML IJ SOLN
40.0000 mg | Freq: Every day | INTRAMUSCULAR | Status: DC
  Administered 2019-10-16 – 2019-10-17 (×2): 40 mg via INTRAVENOUS
  Filled 2019-10-16 (×2): qty 4

## 2019-10-16 NOTE — TOC Initial Note (Signed)
Transition of Care [facility]) - Initial/Assessment Note    Patient Details  Name: Derek Ibarra MRN: [id number] Date of Birth: 10/13/1955  [facility] CM/SW Contact:    Joaquin Courts, RN Phone Number: 10/16/2019, 8:35 AM  Clinical Narrative:     [facility] consult for heart failure home health screen received. Patient will need PT/OT evaluations ordered. CM will follow for recommendations and coordinate as appropriate.               Expected Discharge Plan: Home/Self Care(may need HH, will await PT/OT eval) Barriers to Discharge: Continued Medical Work up   Patient Goals and CMS Choice Patient states their goals for this hospitalization and ongoing recovery are:: to go home      Expected Discharge Plan and Services Expected Discharge Plan: Home/Self Care(may need HH, will await PT/OT eval)   Discharge Planning Services: CM Consult   Living arrangements for the past 2 months: Single Family Home                                      Prior Living Arrangements/Services Living arrangements for the past 2 months: Single Family Home Lives with:: Spouse(fiance) Patient language and need for interpreter reviewed:: Yes Do you feel safe going back to the place where you live?: Yes      Need for Family Participation in Patient Care: Yes (Comment) Care giver support system in place?: Yes (comment)   Criminal Activity/Legal Involvement Pertinent to Current Situation/Hospitalization: No - Comment as needed  Activities of Daily Living Home Assistive Devices/Equipment: CBG Meter, CPAP, Eyeglasses, Other (Comment)(Stair Lift) ADL Screening (condition at time of admission) Patient's cognitive ability adequate to safely complete daily activities?: Yes Is the patient deaf or have difficulty hearing?: No Does the patient have difficulty seeing, even when wearing glasses/contacts?: No Does the patient have difficulty concentrating, remembering, or making decisions?: No Patient  able to express need for assistance with ADLs?: Yes Does the patient have difficulty dressing or bathing?: No Independently performs ADLs?: Yes (appropriate for developmental age) Does the patient have difficulty walking or climbing stairs?: No Weakness of Legs: None Weakness of Arms/Hands: None  Permission Sought/Granted                  Emotional Assessment       Orientation: : Oriented to Self, Oriented to Place, Oriented to  Time, Oriented to Situation   Psych Involvement: No (comment)  Admission diagnosis:  CHF exacerbation ([place]) [I50.9] Acute respiratory failure with hypoxia ([place]) [J96.01] Patient Active Problem List   Diagnosis Date Noted  . CHF exacerbation ([place]) 10/15/2019  . CKD (chronic kidney disease) stage 3, GFR 30-59 ml/min 10/14/2019  . Chronic diastolic heart failure ([place]) 10/29/2014  . Pulmonary edema 10/27/2014  . G6PD deficiency 10/27/2014  . Acute respiratory failure with hypoxemia ([place]) 10/27/2014  . Hypertensive heart disease 10/27/2014  . Smoker 10/27/2014  . Elevated troponin 10/27/2014  . Morbid obesity ([place]) 10/27/2014  . 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 Sleep apnea 10/27/2014   PCP:  [facility] [place]:   [place], [place] [address] [place] [address] Phone: [phone] Fax: [phone]     Social Determinants of Health (SDOH) Interventions    Readmission Risk Interventions No flowsheet data found.

## 2019-10-16 NOTE — Progress Notes (Signed)
PROGRESS NOTE  Derek Ibarra  DOB: Nov 27, 1955  PCP: [facility] [place] [id number]  DOA: 10/14/2019 Admitted From: Home  LOS: 1 day   Chief Complaint  Patient presents with  . Chest Pain   Brief narrative: Derek Ibarra is a 64 y.o. male with PMH of HTN, T2DM, OSA, chronic diastolic CHF, chronic bilateral lower extremity edema on Lasix. Patient presented to the ED on 10/14/2019 with complaint of progressively worsening shortness of breath, chest pressure for 24-hour duration. Patient denies having history of congestive heart failure but states that he had an episode of similar symptoms in the past and was found to have pulmonary edema. In the ED, blood pressure was elevated to 180s.  He was hypoxic and required supplemental oxygen, 2 L/min. Chest x-ray showed bibasilar opacities. COVID-19 PCR negative. Patient was admitted under hospitalist service for further evaluation and management.  Subjective: Patient was seen and examined this morning. Using his CPAP machine. No respiratory distress. Reports feeing better.   Assessment/Plan: Acute on chronic diastolic CHF Hypertensive urgency -Presented with progressively worsening shortness of breath/chest pressure for 24-hour duration. -Patient has blood pressure elevated up to 180s. -With flash/rapidly progressive pulmonary edema due to blood pressure surge in the background of chronic diastolic CHF. -Echocardiogram with EF 60 to 123456, grade 2 diastolic dysfunction. -Home meds include Coreg, clonidine, hydralazine, Lasix 40 mg daily. -Currently on Coreg, clonidine, hydralazine as well as Lasix IV 40 mg daily. -Monitor blood pressure trend.  Maintain strict intake and output. -Monitor renal function and electrolytes.  Hypokalemia.  Mild. Repleted today.  Check labs in am and monitor.   Type 2 diabetes mellitus -Patient states that he takes long-acting U5 100, 70 units in a.m. and 40 units in p.m. -Diabetes coordinator consulted.   -Currently on Lantus 20 units twice daily as well as premeal insulin and sliding scale with Accu-Cheks -Metformin on hold.  Hyperlipidemia -Continue Crestor.  CKD stage IIIb -Baseline creatinine 2.28 from July 2020.  Presented with creatinine of 2.06.  -Continue to monitor. - Scr today at baseline  Morbid obesity - Body mass index is 42.26 kg/m. Patient has been advised to make an attempt to improve diet and exercise patterns to aid in weight loss.  OSA -Continue CPAP.  Body mass index is 42.26 kg/m. Mobility: Encourage ambulation Diet: Cardiac/diabetic diet Fluid: Not on IV fluid DVT prophylaxis:  Lovenox subcu Code Status:  Full code Family Communication:  Not at bedside Expected Discharge:  Anticipate discharge home in 1 to 2 days  Consultants:  None  Procedures:  None  Antimicrobials: Anti-infectives (From admission, onward)   None        Code Status: Full Code   Diet Order            Diet heart healthy/carb modified Room service appropriate? Yes; Fluid consistency: Thin  Diet effective now              Infusions:  . sodium chloride      Scheduled Meds: . carvedilol  25 mg Oral BID WC  . cloNIDine  0.2 mg Oral BID  . enoxaparin (LOVENOX) injection  40 mg Subcutaneous QHS  . hydrALAZINE  100 mg Oral Q8H  . insulin aspart  0-5 Units Subcutaneous QHS  . insulin aspart  0-9 Units Subcutaneous TID WC  . insulin aspart  6 Units Subcutaneous TID WC  . insulin glargine  20 Units Subcutaneous BID  . sodium chloride flush  3 mL Intravenous Q12H    PRN  meds: sodium chloride, acetaminophen, hydrALAZINE, ondansetron (ZOFRAN) IV, sodium chloride flush   Objective: Vitals:   10/16/19 0955 10/16/19 1254  BP: (!) 143/67 (!) 145/72  Pulse:  83  Resp:  16  Temp:  98.3 F (36.8 C)  SpO2:  96%    Intake/Output Summary (Last 24 hours) at 10/16/2019 1656 Last data filed at 10/16/2019 1151 Gross per 24 hour  Intake 1260 ml  Output 1100 ml  Net 160 ml    Filed Weights   10/15/19 1458 10/16/19 0500  Weight: 132.5 kg 129.8 kg   Weight change:  Body mass index is 42.26 kg/m.   Physical Exam: General exam: Appears calm and comfortable.  Morbidly obese. Skin: No rashes, lesions or ulcers. HEENT: Atraumatic, normocephalic, supple neck Lungs: Clear to auscultation bilaterally CVS: regular rate and rhythm GI/Abd soft, nt, nd, bs+ CNS: Alert, oriented x3 Psychiatry: Mood appropriate Extremities: No pedal edema, no calf tenderness  Data Review: I have personally reviewed the laboratory data and studies available.  Recent Labs  Lab 10/14/19 1414  WBC 10.4  HGB 11.7*  HCT 38.2*  MCV 91.6  PLT 195   . Recent Labs  Lab 10/14/19 1414 10/15/19 0604 10/16/19 0529  NA 144 142 141  K 3.7 3.5 3.3*  CL 108 104 104  CO2 26 27 27   GLUCOSE 79 135* 135*  BUN 22 22 30*  CREATININE 2.06* 1.85* 2.27*  CALCIUM 9.1 9.1 8.8*   Time spent: 25 minutes  Blain Pais, MD  [facility] 10/16/2019

## 2019-10-16 NOTE — Progress Notes (Signed)
PT Cancellation Note  Patient Details Name: Derek Ibarra MRN: [id number] DOB: 1956/03/30   Cancelled Treatment:    Reason Eval/Treat Not Completed: PT screened, no needs identified, will sign off. Spoke with pt who denied need for PT services. Will sign off.    Doreatha Massed, PT Acute Rehabilitation

## 2019-10-17 ENCOUNTER — Encounter (HOSPITAL_COMMUNITY): Payer: Self-pay | Admitting: Internal Medicine

## 2019-10-17 DIAGNOSIS — I5033 Acute on chronic diastolic (congestive) heart failure: Secondary | ICD-10-CM

## 2019-10-17 LAB — BASIC METABOLIC PANEL
Anion gap: 10 (ref 5–15)
BUN: 32 mg/dL — ABNORMAL HIGH (ref 8–23)
CO2: 26 mmol/L (ref 22–32)
Calcium: 8.6 mg/dL — ABNORMAL LOW (ref 8.9–10.3)
Chloride: 104 mmol/L (ref 98–111)
Creatinine, Ser: 2.39 mg/dL — ABNORMAL HIGH (ref 0.61–1.24)
GFR calc Af Amer: 32 mL/min — ABNORMAL LOW (ref >60)
GFR calc non Af Amer: 27 mL/min — ABNORMAL LOW (ref >60)
Glucose, Bld: 133 mg/dL — ABNORMAL HIGH (ref 70–99)
Potassium: 3.1 mmol/L — ABNORMAL LOW (ref 3.5–5.1)
Sodium: 140 mmol/L (ref 135–145)

## 2019-10-17 LAB — GLUCOSE, CAPILLARY: Glucose-Capillary: 130 mg/dL — ABNORMAL HIGH (ref 70–99)

## 2019-10-17 MED ORDER — POTASSIUM CHLORIDE CRYS ER 20 MEQ PO TBCR
40.0000 meq | EXTENDED_RELEASE_TABLET | Freq: Once | ORAL | Status: AC
  Administered 2019-10-17: 40 meq via ORAL
  Filled 2019-10-17: qty 2

## 2019-10-17 MED ORDER — FUROSEMIDE 40 MG PO TABS: 40.0000 mg | ORAL_TABLET | Freq: Every day | ORAL | 0 refills | Status: AC

## 2019-10-17 NOTE — Discharge Summary (Signed)
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  Diet recommendation: Low-salt, diabetic diet    Code status: Full code.    Hospital Course:   Derek Ibarra is a 64 y.o. male with PMH of HTN, T2DM, OSA, tobacco abuse, morbid obesity (BMI 0000000) chronic diastolic CHF, chronic bilateral lower extremity edema on Lasix.  He presented to the office with progressiveshortness of breath and chest pressure.  Chest x-ray showed bilateral infiltrates.  He was hypoxemic and required supplemental oxygen.  He was admitted to the hospital for acute exacerbation of chronic diastolic CHF.  He was treated with IV Lasix.  He also received oral potassium chloride for hypokalemia.  He said he takes potassium at home and he does not need to wait to repeat his potassium level.  His condition improved and he was deemed stable for discharge to home today.  He was successfully weaned off of supplemental O2.  Unfortunately, he continues to smoke cigarettes and he was counseled to quit smoking.  However, he jovially responded "mind your own business doc".      Discharge Exam:   Vitals:   10/16/19 2041 10/17/19 0507  BP: (!) 176/80 (!) 151/76  Pulse: 85 78  Resp: 18 20  Temp: 99.1 F (37.3 C) 99.1 F (37.3 C)  SpO2: 94% 97%   Vitals:   10/16/19 2011 10/16/19 2038 10/16/19 2041 10/17/19 0507   BP:  (!) 176/80 (!) 176/80 (!) 151/76  Pulse: 82  85 78  Resp: 16  18 20   Temp:   99.1 F (37.3 C) 99.1 F (37.3 C)  TempSrc:   Oral Oral  SpO2: 95%  94% 97%  Weight:      Height:         GEN: NAD SKIN: No ras EYES: EOMI ENT: MMM CV: RRR PULM: CTA B ABD: soft, obese, NT, +BS CNS: AAO x 3, non focal EXT: No edema or tenderness   The results of significant diagnostics from this hospitalization (including imaging, microbiology, ancillary and laboratory) are listed below for reference.     Procedures and Diagnostic Studies:   DG Chest 2 View  Result Date: 10/14/2019 CLINICAL DATA:  Chest pain EXAM: CHEST - 2 VIEW COMPARISON:  05/08/2019 FINDINGS: Mild cardiomegaly. Unchanged bandlike scarring of the left midlung. Subtle heterogeneous airspace opacity of the bilateral lung bases. Disc degenerative disease of the thoracic spine. IMPRESSION: 1. Subtle heterogeneous airspace opacity at the bilateral lung bases, suspicious for infection. 2. Stable bandlike scarring in the left midlung. 3. Cardiomegaly. Electronically Signed   By: Eddie Candle M.D.   On: 10/14/2019 13:54   ECHOCARDIOGRAM COMPLETE  Result Date: 10/15/2019   ECHOCARDIOGRAM REPORT   Patient Name:   [facility] Date of Exam: 10/15/2019 Medical Rec #:  [id number]  Height:       74.0 in Accession #:    [id number] Weight:       300.0 lb Date  of Birth:  19-Feb-1956  BSA:          2.58 m Patient Age:    80 years   BP:           163/83 mmHg Patient Gender: M          HR:           103 bpm. Exam Location:  Inpatient Procedure: 2D Echo, Cardiac Doppler, Color Doppler and Intracardiac            Opacification Agent Indications:    CHF-Acute Diastolic [id number]  History:        Patient has prior history of Echocardiogram examinations, most                 recent 10/28/2014. CHF; Risk Factors:Diabetes and Current Smoker.                 Elevated troponin.  Sonographer:    Paulita Fujita RDCS Referring Phys: [id number] JARED M GARDNER  Sonographer  Comments: Image acquisition challenging due to patient body habitus. IMPRESSIONS  1. Left ventricular ejection fraction, by visual estimation, is 60 to 65%. The left ventricle has normal function. There is mildly increased left ventricular hypertrophy.  2. Definity contrast agent was given IV to delineate the left ventricular endocardial borders.  3. Left ventricular diastolic parameters are consistent with Grade II diastolic dysfunction (pseudonormalization).  4. The left ventricle has no regional wall motion abnormalities.  5. Global right ventricle has normal systolic function.The right ventricular size is normal. No increase in right ventricular wall thickness.  6. Left atrial size was normal.  7. Right atrial size was normal.  8. The pericardial effusion is circumferential.  9. Trivial pericardial effusion is present. 10. Mild mitral annular calcification. 11. The mitral valve is grossly normal. No evidence of mitral valve regurgitation. 12. The tricuspid valve is grossly normal. 13. The aortic valve is tricuspid. Aortic valve regurgitation is not visualized. 14. The pulmonic valve was not well visualized. Pulmonic valve regurgitation is trivial. 15. TR signal is inadequate for assessing pulmonary artery systolic pressure. 16. The inferior vena cava is normal in size with greater than 50% respiratory variability, suggesting right atrial pressure of 3 mmHg. FINDINGS  Left Ventricle: Left ventricular ejection fraction, by visual estimation, is 60 to 65%. The left ventricle has normal function. Definity contrast agent was given IV to delineate the left ventricular endocardial borders. The left ventricle has no regional wall motion abnormalities. The left ventricular internal cavity size was the left ventricle is normal in size. There is mildly increased left ventricular hypertrophy. Left ventricular diastolic parameters are consistent with Grade II diastolic dysfunction (pseudonormalization). Right Ventricle: The  right ventricular size is normal. No increase in right ventricular wall thickness. Global RV systolic function is has normal systolic function. Left Atrium: Left atrial size was normal in size. Right Atrium: Right atrial size was normal in size Pericardium: Trivial pericardial effusion is present. The pericardial effusion is circumferential. Mitral Valve: The mitral valve is grossly normal. Mild mitral annular calcification. No evidence of mitral valve regurgitation. Tricuspid Valve: The tricuspid valve is grossly normal. Tricuspid valve regurgitation is trivial. Aortic Valve: The aortic valve is tricuspid. Aortic valve regurgitation is not visualized. Mild aortic valve annular calcification. Pulmonic Valve: The pulmonic valve was not well visualized. Pulmonic valve regurgitation is trivial. Pulmonic regurgitation is trivial. Aorta: The aortic root is normal in size and structure. Venous: The inferior vena cava is normal in size with greater than  50% respiratory variability, suggesting right atrial pressure of 3 mmHg. IAS/Shunts: No atrial level shunt detected by color flow Doppler.  LEFT VENTRICLE PLAX 2D LVIDd:         5.00 cm LVIDs:         3.40 cm LV PW:         1.10 cm LV IVS:        1.10 cm LVOT diam:     1.80 cm LV SV:         71 ml LV SV Index:   26.07 LVOT Area:     2.54 cm  RIGHT VENTRICLE TAPSE (M-mode): 2.1 cm LEFT ATRIUM             Index       RIGHT ATRIUM           Index LA diam:        4.40 cm 1.70 cm/m  RA Area:     12.90 cm LA Vol (A2C):   48.0 ml 18.59 ml/m RA Volume:   26.70 ml  10.34 ml/m LA Vol (A4C):   66.6 ml 25.79 ml/m LA Biplane Vol: 60.0 ml 23.24 ml/m  AORTIC VALVE LVOT Vmax:   138.00 cm/s LVOT Vmean:  91.900 cm/s LVOT VTI:    0.247 m  AORTA Ao Root diam: 2.70 cm  SHUNTS Systemic VTI:  0.25 m Systemic Diam: 1.80 cm  Rozann Lesches MD Electronically signed by Rozann Lesches MD Signature Date/Time: 10/15/2019/11:49:02 AM    Final      Labs:   Basic Metabolic Panel: Recent Labs   Lab 10/14/19 1414 10/15/19 0604 10/16/19 0529 10/17/19 0430  NA 144 142 141 140  K 3.7 3.5 3.3* 3.1*  CL 108 104 104 104  CO2 26 27 27 26   GLUCOSE 79 135* 135* 133*  BUN 22 22 30* 32*  CREATININE 2.06* 1.85* 2.27* 2.39*  CALCIUM 9.1 9.1 8.8* 8.6*   GFR Estimated Creatinine Clearance: 41.1 mL/min (A) (by C-G formula based on SCr of 2.39 mg/dL (H)). Liver Function Tests: No results for input(s): AST, ALT, ALKPHOS, BILITOT, PROT, ALBUMIN in the last 168 hours. No results for input(s): LIPASE, AMYLASE in the last 168 hours. No results for input(s): AMMONIA in the last 168 hours. Coagulation profile No results for input(s): INR, PROTIME in the last 168 hours.  CBC: Recent Labs  Lab 10/14/19 1414  WBC 10.4  HGB 11.7*  HCT 38.2*  MCV 91.6  PLT 195   Cardiac Enzymes: No results for input(s): CKTOTAL, CKMB, CKMBINDEX, TROPONINI in the last 168 hours. BNP: Invalid input(s): POCBNP CBG: Recent Labs  Lab 10/16/19 0739 10/16/19 1150 10/16/19 1629 10/16/19 2040 10/17/19 0759  GLUCAP 129* 171* 123* 162* 130*   D-Dimer No results for input(s): DDIMER in the last 72 hours. Hgb A1c Recent Labs    10/15/19 0604  HGBA1C 5.7*   Lipid Profile No results for input(s): CHOL, HDL, LDLCALC, TRIG, CHOLHDL, LDLDIRECT in the last 72 hours. Thyroid function studies No results for input(s): TSH, T4TOTAL, T3FREE, THYROIDAB in the last 72 hours.  Invalid input(s): FREET3 Anemia work up No results for input(s): VITAMINB12, FOLATE, FERRITIN, TIBC, IRON, RETICCTPCT in the last 72 hours. Microbiology Recent Results (from the past 240 hour(s))  SARS CORONAVIRUS 2 (TAT 6-24 HRS) Nasopharyngeal Nasopharyngeal Swab     Status: None   Collection Time: 10/14/19  9:13 PM   Specimen: Nasopharyngeal Swab  Result Value Ref Range Status   SARS Coronavirus 2 NEGATIVE NEGATIVE Final  Comment: (NOTE) SARS-CoV-2 target nucleic acids are NOT DETECTED. The SARS-CoV-2 RNA is generally detectable  in upper and lower respiratory specimens during the acute phase of infection. Negative results do not preclude SARS-CoV-2 infection, do not rule out co-infections with other pathogens, and should not be used as the sole basis for treatment or other patient management decisions. Negative results must be combined with clinical observations, patient history, and epidemiological information. The expected result is Negative. Fact Sheet for Patients: SugarRoll.be Fact Sheet for Healthcare Providers: https://www.woods-mathews.com/ This test is not yet approved or cleared by the [place] FDA and  has been authorized for detection and/or diagnosis of SARS-CoV-2 by FDA under an Emergency Use Authorization (EUA). This EUA will remain  in effect (meaning this test can be used) for the duration of the COVID-19 declaration under Section 56 4(b)(1) of the Act, 21 U.S.C. section 360bbb-3(b)(1), unless the authorization is terminated or revoked sooner. Performed at [facility], [place] [address] [place], [place] [address]      Discharge Instructions:   Discharge Instructions    (HEART FAILURE PATIENTS) Call MD:  Anytime you have any of the following symptoms: 1) 3 pound weight gain in 24 hours or 5 pounds in 1 week 2) shortness of breath, with or without a dry hacking cough 3) swelling in the hands, feet or stomach 4) if you have to sleep on extra pillows at night in order to breathe.   Complete by: As directed    AMB referral to CHF clinic   Complete by: As directed    Diet - low sodium heart healthy   Complete by: As directed    Diet Carb Modified   Complete by: As directed    Increase activity slowly   Complete by: As directed      Allergies as of 10/17/2019      Reactions   Penicillins Rash   Sulfa Antibiotics Rash      Medication List    STOP taking these medications   cefpodoxime 200 MG tablet Commonly known as: VANTIN    predniSONE 20 MG tablet Commonly known as: DELTASONE     TAKE these medications   albuterol 108 (90 Base) MCG/ACT inhaler Commonly known as: VENTOLIN HFA Inhale into the lungs every 6 (six) hours as needed for wheezing or shortness of breath.   allopurinol 100 MG tablet Commonly known as: ZYLOPRIM Take 200 mg by mouth daily.   carvedilol 25 MG tablet Commonly known as: COREG Take 25 mg by mouth 2 (two) times daily with a meal.   cloNIDine 0.1 MG tablet Commonly known as: CATAPRES Take 0.1 mg by mouth 2 (two) times daily.   ferrous sulfate 325 (65 FE) MG tablet Take 325 mg by mouth daily with breakfast.   furosemide 40 MG tablet Commonly known as: LASIX Take 1 tablet (40 mg total) by mouth daily. What changed: medication strength   hydrALAZINE 100 MG tablet Commonly known as: APRESOLINE Take 100 mg by mouth 3 (three) times daily.   insulin glargine 100 UNIT/ML injection Commonly known as: LANTUS Inject 40 Units into the skin 2 (two) times daily.   lisinopril 40 MG tablet Commonly known as: ZESTRIL Take 40 mg by mouth daily.   metFORMIN 500 MG 24 hr tablet Commonly known as: GLUCOPHAGE-XR Take 500 mg by mouth daily with breakfast.   multivitamin with minerals Tabs tablet Take 1 tablet by mouth daily.   oxyCODONE-acetaminophen 5-325 MG tablet Commonly known as: PERCOCET/ROXICET Take 1  tablet by mouth every 4 (four) hours as needed for severe pain.   potassium chloride SA 20 MEQ tablet Commonly known as: KLOR-CON Take 20 mEq by mouth daily.   rosuvastatin 10 MG tablet Commonly known as: CRESTOR Take 10 mg by mouth daily.         Time coordinating discharge: 25 minutes  Signed:  Glorie Dowlen  [facility] 10/17/2019, 10:33 AM

## 2019-10-17 NOTE — Progress Notes (Signed)
OT Cancellation Note  Patient Details Name: Derek Ibarra MRN: [id number] DOB: [birth year]   Cancelled Treatment:    Reason Eval/Treat Not Completed: OT screened, no needs identified, will sign off  [name] 10/17/2019, 9:08 AM  [name], OTR/L Acute Rehabilitation Services 10/17/2019

## 2020-10-12 HISTORY — PX: CATARACT EXTRACTION: SUR2

## 2021-03-15 IMAGING — DX DG CHEST 2V
2 series · 2 of 2 positions shown · non-contrast
Comparison: 10/14/2019

CLINICAL DATA: Shortness of breath

EXAM:
CHEST - 2 VIEW

[chest pa]
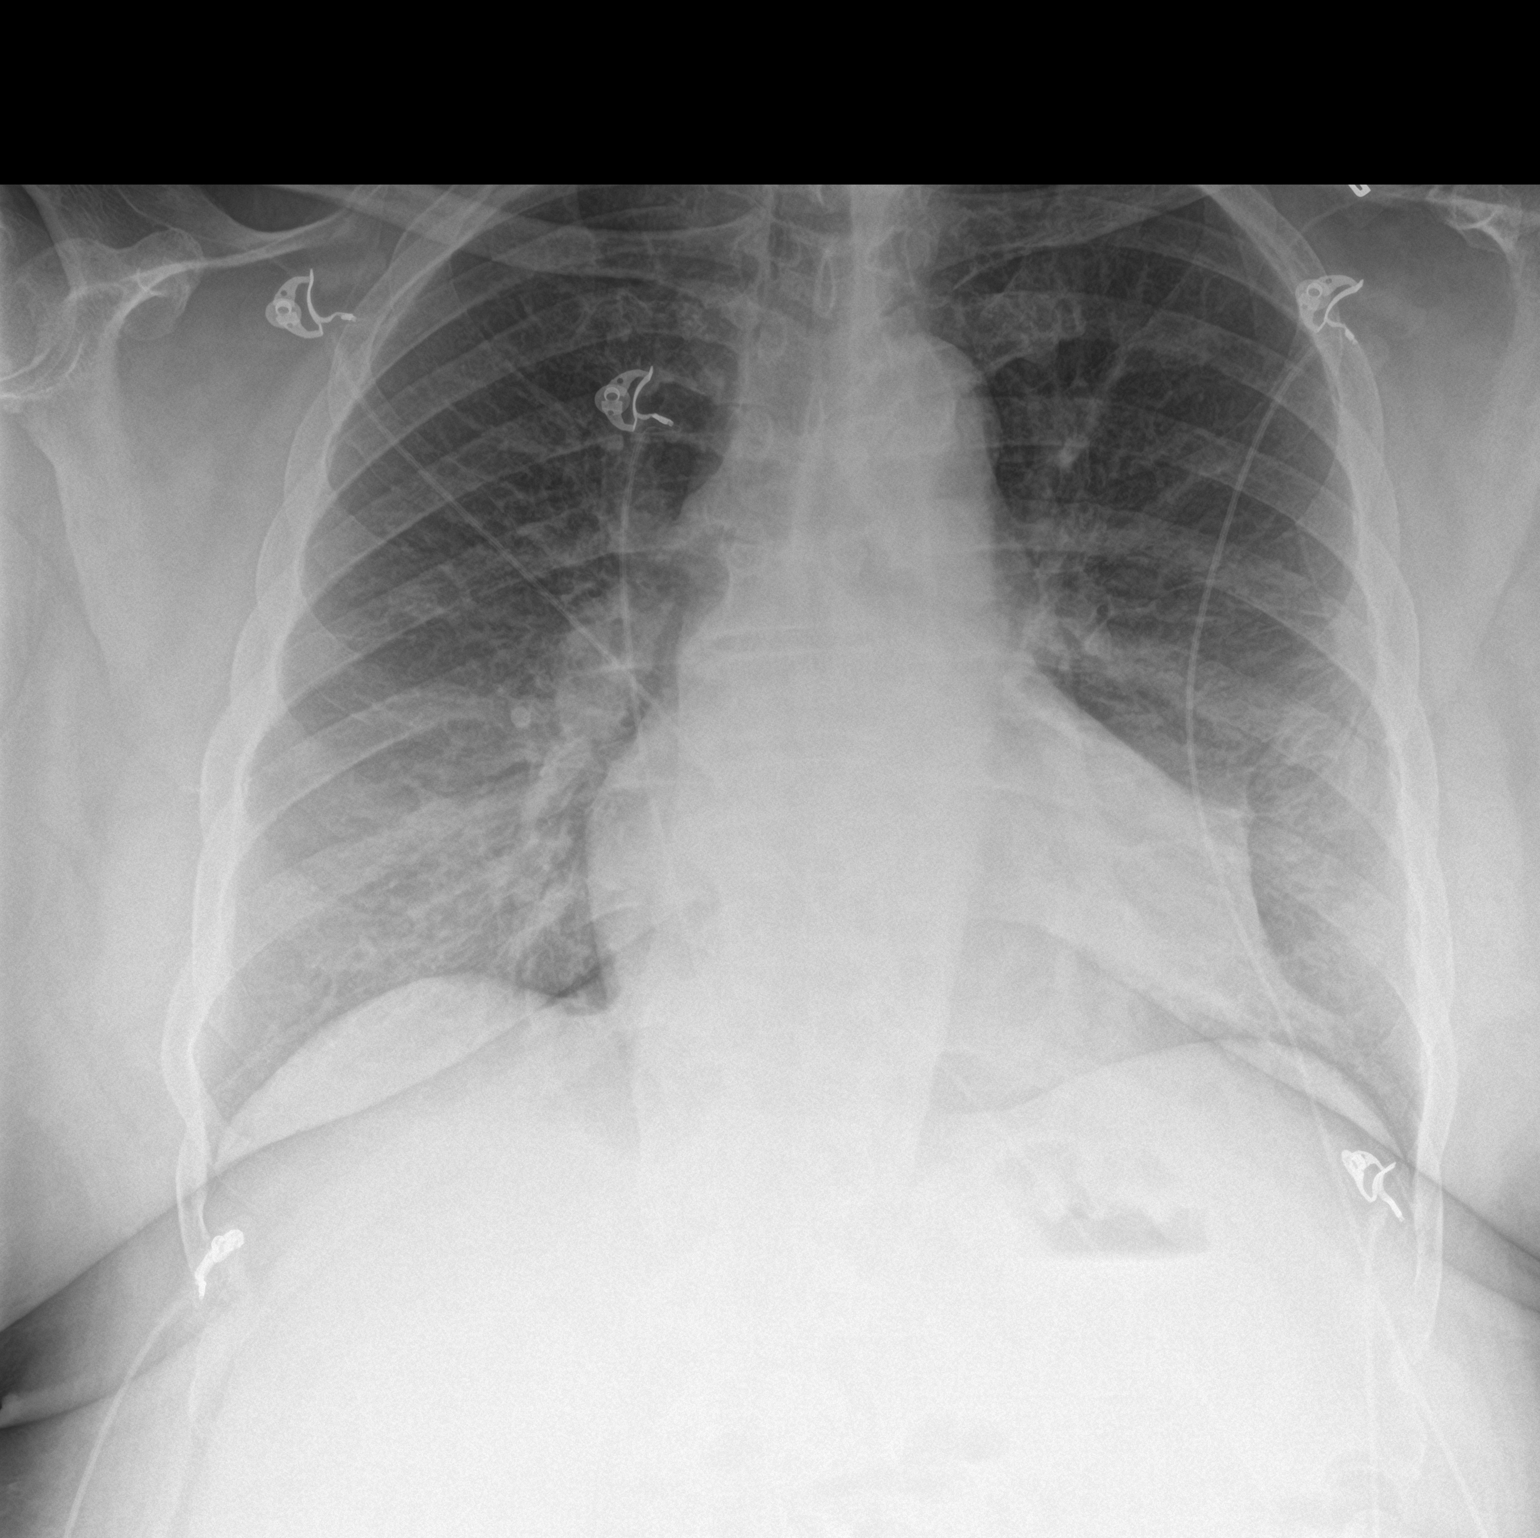

[chest lat]
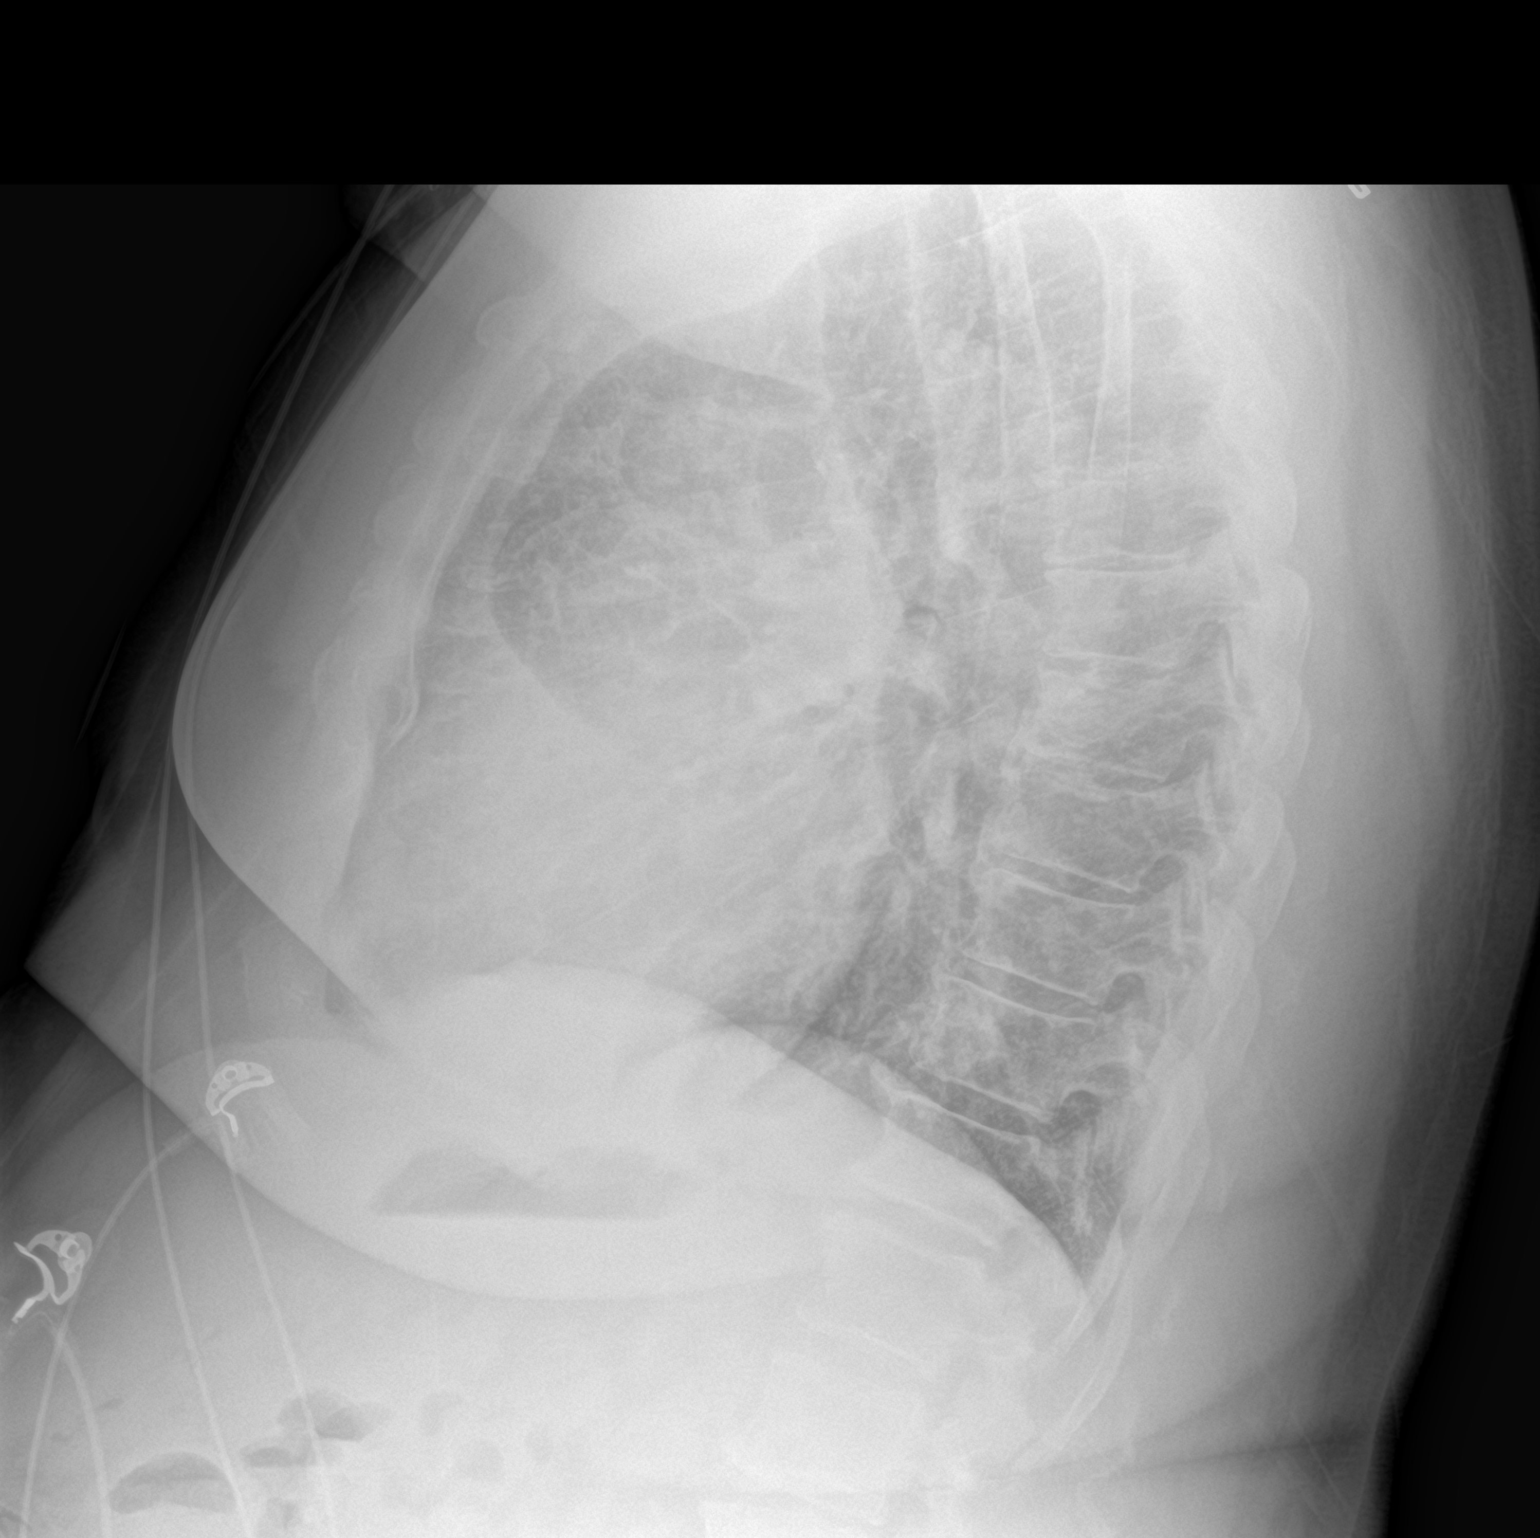

[2 of 2 positions shown; findings below may reference images not displayed]

FINDINGS: No new consolidation or edema. Previously question bibasilar
opacities are not well seen. Unchanged left midlung scarring. Stable
cardiomediastinal contours. No pleural effusion or pneumothorax.
IMPRESSION: No acute process in the chest.

## 2021-06-19 ENCOUNTER — Encounter (HOSPITAL_COMMUNITY): Payer: Self-pay | Admitting: Orthopedic Surgery

## 2021-06-19 NOTE — H&P (Signed)
Anticipated LOS equal to or greater than 2 midnights due to - Age 67 and older with one or more of the following:  - Obesity  - Expected need for hospital services (PT, OT, Nursing) required for safe  discharge  - Anticipated need for postoperative skilled nursing care or inpatient rehab  - Active co-morbidities: need for PT/OT OR   - Unanticipated findings during/Post Surgery: None  - Patient is a high risk of re-admission due to: None   Derek Ibarra is an 65 y.o. male.    Chief Complaint: right knee pain  HPI: Pt is a 65 y.o. male complaining of right knee pain for 1 week after a fall Pain had continually increased since the beginning. X-rays in the clinic show displaced right patella fracture. Various options are discussed with the patient. Risks, benefits and expectations were discussed with the patient. Patient understand the risks, benefits and expectations and wishes to proceed with surgery.   [facility]P:  [facility]  D/C Plans: Home  PMH: Past Medical History:  Diagnosis Date   Arthritis    Diabetes mellitus without complication ([place])    Dyspnea    G6PD deficiency    Hypertension    Hypertensive heart disease    Morbid obesity ([place])    OSA on CPAP    16 CM pressure   Sleep apnea     PSH: Past Surgical History:  Procedure Laterality Date   CATARACT EXTRACTION Left 2022   [facility] in [place] Left 2021   [facility] in [place] ARCUATE LIGAMENT RELEASE Bilateral 1980    Social History:  reports that he has been smoking cigarettes. He has a 30.00 pack-year smoking history. He has never used smokeless tobacco. He reports that he does not drink alcohol and does not use drugs.  Allergies:  Allergies  Allergen Reactions   Penicillins Rash   Sulfa Antibiotics Rash    Medications: No current facility-administered medications for this encounter.   Current Outpatient Medications  Medication Sig Dispense  Refill   albuterol (PROVENTIL HFA;VENTOLIN HFA) 108 (90 BASE) MCG/ACT inhaler Inhale into the lungs every 6 (six) hours as needed for wheezing or shortness of breath.     allopurinol (ZYLOPRIM) 100 MG tablet Take 200 mg by mouth daily.     carvedilol (COREG) 25 MG tablet Take 25 mg by mouth 2 (two) times daily with a meal.     cloNIDine (CATAPRES) 0.1 MG tablet Take 0.1 mg by mouth 2 (two) times daily.     ferrous sulfate 325 (65 FE) MG tablet Take 325 mg by mouth daily with breakfast.     furosemide (LASIX) 40 MG tablet Take 1 tablet (40 mg total) by mouth daily. 30 tablet 0   hydrALAZINE (APRESOLINE) 100 MG tablet Take 100 mg by mouth 3 (three) times daily.     insulin glargine (LANTUS) 100 UNIT/ML injection Inject 40 Units into the skin 2 (two) times daily.     lisinopril (PRINIVIL,ZESTRIL) 40 MG tablet Take 40 mg by mouth daily.     metFORMIN (GLUCOPHAGE-XR) 500 MG 24 hr tablet Take 500 mg by mouth daily with breakfast.     Multiple Vitamin (MULTIVITAMIN WITH MINERALS) TABS tablet Take 1 tablet by mouth daily.     oxyCODONE-acetaminophen (PERCOCET/ROXICET) 5-325 MG tablet Take 1 tablet by mouth every 4 (four) hours as needed for severe pain. 15 tablet 0   potassium chloride SA (  K-DUR,KLOR-CON) 20 MEQ tablet Take 20 mEq by mouth daily.     rosuvastatin (CRESTOR) 10 MG tablet Take 10 mg by mouth daily.      No results found for this or any previous visit (from the past 48 hour(s)). No results found.  ROS: Pain with rom of the right lower extremity  Physical Exam: Alert and oriented 65 y.o. male in no acute distress Cranial nerves 2-12 intact Cervical spine: full rom with no tenderness, nv intact distally Chest: active breath sounds bilaterally, no wheeze rhonchi or rales Heart: regular rate and rhythm, no murmur Abd: non tender non distended with active bowel sounds Hip is stable with rom  Right knee with moderate effusion and straight leg Unable to bear weight on right leg Nv  intact distally  Assessment/Plan Assessment: right patella fracture, displaced, closed  Plan:  Patient will undergo a right patella ORIF by Dr. Veverly Fells at [facility] Risks benefits and expectations were discussed with the patient. Patient understand risks, benefits and expectations and wishes to proceed. Preoperative templating of the joint replacement has been completed, documented, and submitted to the Operating Room personnel in order to optimize intra-operative equipment management.   Merla Riches PA-C, MPAS [facility] is now [employer] [address], [place], [place], [place] [address] Phone: [phone] [email or web address] Facebook  [employer]

## 2021-06-20 ENCOUNTER — Inpatient Hospital Stay (HOSPITAL_COMMUNITY): Payer: No Typology Code available for payment source | Admitting: Anesthesiology

## 2021-06-20 ENCOUNTER — Inpatient Hospital Stay (HOSPITAL_COMMUNITY)
Admission: AD | Admit: 2021-06-20 | Discharge: 2021-06-27 | DRG: 516 | Disposition: A | Discharge status: Home Health | Payer: No Typology Code available for payment source | Attending: Orthopedic Surgery | Admitting: Orthopedic Surgery

## 2021-06-20 ENCOUNTER — Inpatient Hospital Stay (HOSPITAL_COMMUNITY): Payer: No Typology Code available for payment source

## 2021-06-20 ENCOUNTER — Encounter (HOSPITAL_COMMUNITY): Payer: Self-pay | Admitting: Orthopedic Surgery

## 2021-06-20 ENCOUNTER — Encounter (HOSPITAL_COMMUNITY)
Admission: AD | Disposition: A | Discharge status: Home Health | Payer: Self-pay | Source: Home / Self Care | Attending: Orthopedic Surgery

## 2021-06-20 DIAGNOSIS — Z79899 Other long term (current) drug therapy: Secondary | ICD-10-CM

## 2021-06-20 DIAGNOSIS — G4733 Obstructive sleep apnea (adult) (pediatric): Secondary | ICD-10-CM | POA: Diagnosis present

## 2021-06-20 DIAGNOSIS — I5032 Chronic diastolic (congestive) heart failure: Secondary | ICD-10-CM | POA: Diagnosis present

## 2021-06-20 DIAGNOSIS — N184 Chronic kidney disease, stage 4 (severe): Secondary | ICD-10-CM | POA: Diagnosis present

## 2021-06-20 DIAGNOSIS — Z20822 Contact with and (suspected) exposure to covid-19: Secondary | ICD-10-CM | POA: Diagnosis present

## 2021-06-20 DIAGNOSIS — S82001A Unspecified fracture of right patella, initial encounter for closed fracture: Secondary | ICD-10-CM | POA: Diagnosis present

## 2021-06-20 DIAGNOSIS — Z7984 Long term (current) use of oral hypoglycemic drugs: Secondary | ICD-10-CM

## 2021-06-20 DIAGNOSIS — Z880 Allergy status to penicillin: Secondary | ICD-10-CM

## 2021-06-20 DIAGNOSIS — Z882 Allergy status to sulfonamides status: Secondary | ICD-10-CM

## 2021-06-20 DIAGNOSIS — N179 Acute kidney failure, unspecified: Secondary | ICD-10-CM | POA: Diagnosis present

## 2021-06-20 DIAGNOSIS — S82041A Displaced comminuted fracture of right patella, initial encounter for closed fracture: Principal | ICD-10-CM | POA: Diagnosis present

## 2021-06-20 DIAGNOSIS — Z09 Encounter for follow-up examination after completed treatment for conditions other than malignant neoplasm: Secondary | ICD-10-CM

## 2021-06-20 DIAGNOSIS — S82009A Unspecified fracture of unspecified patella, initial encounter for closed fracture: Secondary | ICD-10-CM

## 2021-06-20 DIAGNOSIS — E11649 Type 2 diabetes mellitus with hypoglycemia without coma: Secondary | ICD-10-CM | POA: Diagnosis present

## 2021-06-20 DIAGNOSIS — Z6838 Body mass index (BMI) 38.0-38.9, adult: Secondary | ICD-10-CM

## 2021-06-20 DIAGNOSIS — W19XXXA Unspecified fall, initial encounter: Secondary | ICD-10-CM | POA: Diagnosis present

## 2021-06-20 DIAGNOSIS — M109 Gout, unspecified: Secondary | ICD-10-CM | POA: Diagnosis present

## 2021-06-20 DIAGNOSIS — I130 Hypertensive heart and chronic kidney disease with heart failure and stage 1 through stage 4 chronic kidney disease, or unspecified chronic kidney disease: Secondary | ICD-10-CM | POA: Diagnosis present

## 2021-06-20 DIAGNOSIS — D75A Glucose-6-phosphate dehydrogenase (G6PD) deficiency without anemia: Secondary | ICD-10-CM | POA: Diagnosis present

## 2021-06-20 DIAGNOSIS — F17210 Nicotine dependence, cigarettes, uncomplicated: Secondary | ICD-10-CM | POA: Diagnosis present

## 2021-06-20 DIAGNOSIS — Z794 Long term (current) use of insulin: Secondary | ICD-10-CM

## 2021-06-20 HISTORY — DX: Gout, unspecified: M10.9

## 2021-06-20 HISTORY — DX: Type 2 diabetes mellitus without complications: E11.9

## 2021-06-20 HISTORY — DX: Unspecified osteoarthritis, unspecified site: M19.90

## 2021-06-20 HISTORY — PX: ORIF PATELLA: SHX5033

## 2021-06-20 HISTORY — DX: Dyspnea, unspecified: R06.00

## 2021-06-20 LAB — CBC
HCT: 35.3 % — ABNORMAL LOW (ref 39.0–52.0)
Hemoglobin: 11.1 g/dL — ABNORMAL LOW (ref 13.0–17.0)
MCH: 29.1 pg (ref 26.0–34.0)
MCHC: 31.4 g/dL (ref 30.0–36.0)
MCV: 92.4 fL (ref 80.0–100.0)
Platelets: 279 10*3/uL (ref 150–400)
RBC: 3.82 MIL/uL — ABNORMAL LOW (ref 4.22–5.81)
RDW: 14.8 % (ref 11.5–15.5)
WBC: 11.7 10*3/uL — ABNORMAL HIGH (ref 4.0–10.5)
nRBC: 0 % (ref 0.0–0.2)

## 2021-06-20 LAB — BASIC METABOLIC PANEL
Anion gap: 11 (ref 5–15)
BUN: 61 mg/dL — ABNORMAL HIGH (ref 8–23)
CO2: 21 mmol/L — ABNORMAL LOW (ref 22–32)
Calcium: 9.2 mg/dL (ref 8.9–10.3)
Chloride: 106 mmol/L (ref 98–111)
Creatinine, Ser: 3.39 mg/dL — ABNORMAL HIGH (ref 0.61–1.24)
GFR, Estimated: 19 mL/min — ABNORMAL LOW (ref >60)
Glucose, Bld: 299 mg/dL — ABNORMAL HIGH (ref 70–99)
Potassium: 4.5 mmol/L (ref 3.5–5.1)
Sodium: 138 mmol/L (ref 135–145)

## 2021-06-20 LAB — SARS CORONAVIRUS 2 BY RT PCR (HOSPITAL ORDER, PERFORMED IN ~~LOC~~ HOSPITAL LAB): SARS Coronavirus 2: NEGATIVE

## 2021-06-20 LAB — GLUCOSE, CAPILLARY
Glucose-Capillary: 312 mg/dL — ABNORMAL HIGH (ref 70–99)
Glucose-Capillary: 248 mg/dL — ABNORMAL HIGH (ref 70–99)
Glucose-Capillary: 268 mg/dL — ABNORMAL HIGH (ref 70–99)
Glucose-Capillary: 201 mg/dL — ABNORMAL HIGH (ref 70–99)

## 2021-06-20 SURGERY — OPEN REDUCTION INTERNAL FIXATION (ORIF) PATELLA
Anesthesia: General | Site: Knee | Laterality: Right

## 2021-06-20 MED ORDER — INSULIN GLARGINE-YFGN 100 UNIT/ML ~~LOC~~ SOLN
40.0000 [IU] | Freq: Two times a day (BID) | SUBCUTANEOUS | Status: DC
  Administered 2021-06-21 – 2021-06-27 (×14): 40 [IU] via SUBCUTANEOUS
  Filled 2021-06-20 (×17): qty 0.4

## 2021-06-20 MED ORDER — INSULIN ASPART 100 UNIT/ML IJ SOLN
6.0000 [IU] | Freq: Three times a day (TID) | INTRAMUSCULAR | Status: DC
  Administered 2021-06-21 – 2021-06-27 (×17): 6 [IU] via SUBCUTANEOUS

## 2021-06-20 MED ORDER — ONDANSETRON HCL 4 MG/2ML IJ SOLN: 4.0000 mg | Freq: Once | INTRAMUSCULAR | Status: DC | PRN

## 2021-06-20 MED ORDER — ACETAMINOPHEN 10 MG/ML IV SOLN: 1000.0000 mg | Freq: Once | INTRAVENOUS | Status: DC | PRN

## 2021-06-20 MED ORDER — ACETAMINOPHEN 10 MG/ML IV SOLN
INTRAVENOUS | Status: AC
  Filled 2021-06-20: qty 100

## 2021-06-20 MED ORDER — LACTATED RINGERS IV SOLN: INTRAVENOUS | Status: DC

## 2021-06-20 MED ORDER — FENTANYL CITRATE (PF) 100 MCG/2ML IJ SOLN
INTRAMUSCULAR | Status: AC
  Filled 2021-06-20: qty 2

## 2021-06-20 MED ORDER — HYDROMORPHONE HCL 1 MG/ML IJ SOLN
0.2500 mg | INTRAMUSCULAR | Status: DC | PRN
  Administered 2021-06-20: 0.5 mg via INTRAVENOUS

## 2021-06-20 MED ORDER — FENTANYL CITRATE (PF) 100 MCG/2ML IJ SOLN
INTRAMUSCULAR | Status: DC | PRN
  Administered 2021-06-20 (×2): 100 ug via INTRAVENOUS
  Administered 2021-06-20 (×2): 50 ug via INTRAVENOUS

## 2021-06-20 MED ORDER — BUPIVACAINE-EPINEPHRINE (PF) 0.5% -1:200000 IJ SOLN
INTRAMUSCULAR | Status: DC | PRN
  Administered 2021-06-20: 15 mL via PERINEURAL

## 2021-06-20 MED ORDER — ROPIVACAINE HCL 5 MG/ML IJ SOLN
INTRAMUSCULAR | Status: DC | PRN
  Administered 2021-06-20: 30 mL via PERINEURAL

## 2021-06-20 MED ORDER — CARVEDILOL 25 MG PO TABS
25.0000 mg | ORAL_TABLET | Freq: Two times a day (BID) | ORAL | Status: DC
  Administered 2021-06-21 – 2021-06-27 (×14): 25 mg via ORAL
  Filled 2021-06-20 (×14): qty 1

## 2021-06-20 MED ORDER — HYDROMORPHONE HCL 2 MG/ML IJ SOLN
INTRAMUSCULAR | Status: AC
  Filled 2021-06-20: qty 1

## 2021-06-20 MED ORDER — POTASSIUM CHLORIDE CRYS ER 20 MEQ PO TBCR
20.0000 meq | EXTENDED_RELEASE_TABLET | Freq: Every day | ORAL | Status: DC
  Filled 2021-06-20: qty 1

## 2021-06-20 MED ORDER — LABETALOL HCL 5 MG/ML IV SOLN
10.0000 mg | Freq: Once | INTRAVENOUS | Status: AC
  Administered 2021-06-20: 10 mg via INTRAVENOUS

## 2021-06-20 MED ORDER — INSULIN ASPART 100 UNIT/ML IJ SOLN
0.0000 [IU] | Freq: Three times a day (TID) | INTRAMUSCULAR | Status: DC
  Administered 2021-06-21: 11 [IU] via SUBCUTANEOUS
  Administered 2021-06-21 – 2021-06-22 (×2): 4 [IU] via SUBCUTANEOUS
  Administered 2021-06-22: 3 [IU] via SUBCUTANEOUS
  Administered 2021-06-22: 9 [IU] via SUBCUTANEOUS
  Administered 2021-06-23 (×2): 3 [IU] via SUBCUTANEOUS
  Administered 2021-06-24: 4 [IU] via SUBCUTANEOUS
  Administered 2021-06-25 – 2021-06-27 (×3): 3 [IU] via SUBCUTANEOUS

## 2021-06-20 MED ORDER — METOCLOPRAMIDE HCL 5 MG/ML IJ SOLN
5.0000 mg | Freq: Three times a day (TID) | INTRAMUSCULAR | Status: DC | PRN
  Administered 2021-06-21: 10 mg via INTRAVENOUS
  Filled 2021-06-20: qty 2

## 2021-06-20 MED ORDER — CHLORHEXIDINE GLUCONATE 0.12 % MT SOLN
15.0000 mL | Freq: Once | OROMUCOSAL | Status: AC
  Administered 2021-06-20: 15 mL via OROMUCOSAL

## 2021-06-20 MED ORDER — LIDOCAINE 2% (20 MG/ML) 5 ML SYRINGE
INTRAMUSCULAR | Status: DC | PRN
  Administered 2021-06-20: 100 mg via INTRAVENOUS

## 2021-06-20 MED ORDER — INSULIN ASPART 100 UNIT/ML IJ SOLN
0.0000 [IU] | Freq: Every day | INTRAMUSCULAR | Status: DC
  Administered 2021-06-20: 2 [IU] via SUBCUTANEOUS

## 2021-06-20 MED ORDER — ADULT MULTIVITAMIN W/MINERALS CH
1.0000 | ORAL_TABLET | Freq: Every day | ORAL | Status: DC
  Administered 2021-06-21 – 2021-06-27 (×7): 1 via ORAL
  Filled 2021-06-20 (×7): qty 1

## 2021-06-20 MED ORDER — PROPOFOL 500 MG/50ML IV EMUL
INTRAVENOUS | Status: DC | PRN
  Administered 2021-06-20: 200 mg via INTRAVENOUS

## 2021-06-20 MED ORDER — ALBUTEROL SULFATE (2.5 MG/3ML) 0.083% IN NEBU: 3.0000 mL | INHALATION_SOLUTION | Freq: Four times a day (QID) | RESPIRATORY_TRACT | Status: DC | PRN

## 2021-06-20 MED ORDER — ONDANSETRON HCL 4 MG/2ML IJ SOLN
4.0000 mg | Freq: Four times a day (QID) | INTRAMUSCULAR | Status: DC | PRN
  Administered 2021-06-20 – 2021-06-23 (×3): 4 mg via INTRAVENOUS
  Filled 2021-06-20 (×3): qty 2

## 2021-06-20 MED ORDER — HYDROMORPHONE HCL 1 MG/ML IJ SOLN
INTRAMUSCULAR | Status: AC
  Administered 2021-06-20: 0.5 mg via INTRAVENOUS
  Filled 2021-06-20: qty 1

## 2021-06-20 MED ORDER — FENTANYL CITRATE PF 50 MCG/ML IJ SOSY
50.0000 ug | PREFILLED_SYRINGE | INTRAMUSCULAR | Status: DC
  Administered 2021-06-20: 50 ug via INTRAVENOUS

## 2021-06-20 MED ORDER — ONDANSETRON HCL 4 MG PO TABS
4.0000 mg | ORAL_TABLET | Freq: Four times a day (QID) | ORAL | Status: DC | PRN
  Administered 2021-06-27: 4 mg via ORAL
  Filled 2021-06-20: qty 1

## 2021-06-20 MED ORDER — METHOCARBAMOL 500 MG IVPB - SIMPLE MED
500.0000 mg | Freq: Four times a day (QID) | INTRAVENOUS | Status: DC | PRN
  Administered 2021-06-20: 500 mg via INTRAVENOUS
  Filled 2021-06-20: qty 50

## 2021-06-20 MED ORDER — INSULIN ASPART 100 UNIT/ML IJ SOLN
6.0000 [IU] | Freq: Once | INTRAMUSCULAR | Status: AC
  Administered 2021-06-20: 6 [IU] via SUBCUTANEOUS

## 2021-06-20 MED ORDER — ACETAMINOPHEN 325 MG PO TABS: 325.0000 mg | ORAL_TABLET | Freq: Four times a day (QID) | ORAL | Status: DC | PRN

## 2021-06-20 MED ORDER — HYDROMORPHONE HCL 1 MG/ML IJ SOLN
0.5000 mg | INTRAMUSCULAR | Status: DC | PRN
  Administered 2021-06-20: 1 mg via INTRAVENOUS
  Filled 2021-06-20: qty 1

## 2021-06-20 MED ORDER — BISACODYL 10 MG RE SUPP: 10.0000 mg | Freq: Every day | RECTAL | Status: DC | PRN

## 2021-06-20 MED ORDER — DOCUSATE SODIUM 100 MG PO CAPS
100.0000 mg | ORAL_CAPSULE | Freq: Two times a day (BID) | ORAL | Status: DC
  Administered 2021-06-20 – 2021-06-27 (×14): 100 mg via ORAL
  Filled 2021-06-20 (×15): qty 1

## 2021-06-20 MED ORDER — HYDRALAZINE HCL 50 MG PO TABS
100.0000 mg | ORAL_TABLET | Freq: Three times a day (TID) | ORAL | Status: DC
  Administered 2021-06-20 – 2021-06-27 (×19): 100 mg via ORAL
  Filled 2021-06-20 (×21): qty 2

## 2021-06-20 MED ORDER — ROSUVASTATIN CALCIUM 10 MG PO TABS
10.0000 mg | ORAL_TABLET | Freq: Every day | ORAL | Status: DC
  Administered 2021-06-21 – 2021-06-27 (×6): 10 mg via ORAL
  Filled 2021-06-20 (×7): qty 1

## 2021-06-20 MED ORDER — PHENOL 1.4 % MT LIQD: 1.0000 | OROMUCOSAL | Status: DC | PRN

## 2021-06-20 MED ORDER — INSULIN ASPART 100 UNIT/ML IJ SOLN
INTRAMUSCULAR | Status: AC
  Filled 2021-06-20: qty 1

## 2021-06-20 MED ORDER — HYDROMORPHONE HCL 1 MG/ML IJ SOLN
INTRAMUSCULAR | Status: AC
  Filled 2021-06-20: qty 1

## 2021-06-20 MED ORDER — INSULIN ASPART 100 UNIT/ML IJ SOLN
INTRAMUSCULAR | Status: AC
  Administered 2021-06-20: 8 [IU] via SUBCUTANEOUS
  Filled 2021-06-20: qty 1

## 2021-06-20 MED ORDER — CLONIDINE HCL 0.1 MG PO TABS
0.1000 mg | ORAL_TABLET | Freq: Two times a day (BID) | ORAL | Status: DC
  Administered 2021-06-20 – 2021-06-22 (×4): 0.1 mg via ORAL
  Filled 2021-06-20 (×4): qty 1

## 2021-06-20 MED ORDER — ENOXAPARIN SODIUM 30 MG/0.3ML IJ SOSY
30.0000 mg | PREFILLED_SYRINGE | Freq: Two times a day (BID) | INTRAMUSCULAR | Status: DC
  Administered 2021-06-21 – 2021-06-27 (×14): 30 mg via SUBCUTANEOUS
  Filled 2021-06-20 (×14): qty 0.3

## 2021-06-20 MED ORDER — LABETALOL HCL 5 MG/ML IV SOLN
INTRAVENOUS | Status: AC
  Administered 2021-06-20: 10 mg via INTRAVENOUS
  Filled 2021-06-20: qty 4

## 2021-06-20 MED ORDER — HYDRALAZINE HCL 20 MG/ML IJ SOLN: 10.0000 mg | Freq: Once | INTRAMUSCULAR | Status: AC

## 2021-06-20 MED ORDER — SODIUM CHLORIDE 0.9 % IV SOLN: INTRAVENOUS | Status: DC

## 2021-06-20 MED ORDER — POLYETHYLENE GLYCOL 3350 17 G PO PACK
17.0000 g | PACK | Freq: Every day | ORAL | Status: DC | PRN
  Administered 2021-06-24 – 2021-06-27 (×2): 17 g via ORAL
  Filled 2021-06-20 (×2): qty 1

## 2021-06-20 MED ORDER — MIDAZOLAM HCL 2 MG/2ML IJ SOLN
1.0000 mg | INTRAMUSCULAR | Status: DC
  Administered 2021-06-20: 1 mg via INTRAVENOUS

## 2021-06-20 MED ORDER — METHOCARBAMOL 500 MG PO TABS
500.0000 mg | ORAL_TABLET | Freq: Four times a day (QID) | ORAL | Status: DC | PRN
  Administered 2021-06-21 – 2021-06-26 (×4): 500 mg via ORAL
  Filled 2021-06-20 (×4): qty 1

## 2021-06-20 MED ORDER — MENTHOL 3 MG MT LOZG: 1.0000 | LOZENGE | OROMUCOSAL | Status: DC | PRN

## 2021-06-20 MED ORDER — FENTANYL CITRATE PF 50 MCG/ML IJ SOSY
PREFILLED_SYRINGE | INTRAMUSCULAR | Status: AC
  Filled 2021-06-20: qty 2

## 2021-06-20 MED ORDER — OXYCODONE HCL 5 MG PO TABS
ORAL_TABLET | ORAL | Status: AC
  Filled 2021-06-20: qty 1

## 2021-06-20 MED ORDER — METOCLOPRAMIDE HCL 5 MG PO TABS: 5.0000 mg | ORAL_TABLET | Freq: Three times a day (TID) | ORAL | Status: DC | PRN

## 2021-06-20 MED ORDER — LISINOPRIL 20 MG PO TABS
40.0000 mg | ORAL_TABLET | Freq: Every day | ORAL | Status: DC
  Administered 2021-06-21: 40 mg via ORAL
  Filled 2021-06-20: qty 2

## 2021-06-20 MED ORDER — MIDAZOLAM HCL 2 MG/2ML IJ SOLN
INTRAMUSCULAR | Status: AC
  Filled 2021-06-20: qty 2

## 2021-06-20 MED ORDER — CEFAZOLIN SODIUM-DEXTROSE 2-4 GM/100ML-% IV SOLN
2.0000 g | Freq: Four times a day (QID) | INTRAVENOUS | Status: AC
  Administered 2021-06-20 – 2021-06-21 (×2): 2 g via INTRAVENOUS
  Filled 2021-06-20 (×2): qty 100

## 2021-06-20 MED ORDER — OXYCODONE HCL 5 MG PO TABS
10.0000 mg | ORAL_TABLET | ORAL | Status: DC | PRN
  Administered 2021-06-21: 15 mg via ORAL
  Administered 2021-06-21 (×2): 10 mg via ORAL
  Administered 2021-06-21 – 2021-06-23 (×5): 15 mg via ORAL
  Administered 2021-06-26: 10 mg via ORAL
  Filled 2021-06-20 (×3): qty 3
  Filled 2021-06-20: qty 2
  Filled 2021-06-20 (×4): qty 3

## 2021-06-20 MED ORDER — OXYCODONE HCL 5 MG PO TABS: 5.0000 mg | ORAL_TABLET | Freq: Once | ORAL | Status: DC | PRN

## 2021-06-20 MED ORDER — LABETALOL HCL 5 MG/ML IV SOLN: 10.0000 mg | Freq: Once | INTRAVENOUS | Status: AC

## 2021-06-20 MED ORDER — 0.9 % SODIUM CHLORIDE (POUR BTL) OPTIME
TOPICAL | Status: DC | PRN
  Administered 2021-06-20: 1000 mL

## 2021-06-20 MED ORDER — CEFAZOLIN IN SODIUM CHLORIDE 3-0.9 GM/100ML-% IV SOLN
3.0000 g | INTRAVENOUS | Status: AC
  Administered 2021-06-20: 3 g via INTRAVENOUS
  Filled 2021-06-20: qty 100

## 2021-06-20 MED ORDER — ONDANSETRON HCL 4 MG/2ML IJ SOLN
INTRAMUSCULAR | Status: AC
  Filled 2021-06-20: qty 2

## 2021-06-20 MED ORDER — MIDAZOLAM HCL 2 MG/2ML IJ SOLN
INTRAMUSCULAR | Status: DC | PRN
  Administered 2021-06-20: 1 mg via INTRAVENOUS

## 2021-06-20 MED ORDER — OXYCODONE HCL 5 MG/5ML PO SOLN
5.0000 mg | Freq: Once | ORAL | Status: DC | PRN
Start: 2021-06-20 — End: 2021-06-20

## 2021-06-20 MED ORDER — INSULIN ASPART 100 UNIT/ML IJ SOLN: 8.0000 [IU] | Freq: Once | INTRAMUSCULAR | Status: AC

## 2021-06-20 MED ORDER — OXYCODONE HCL 5 MG PO TABS
5.0000 mg | ORAL_TABLET | ORAL | Status: DC | PRN
  Administered 2021-06-20 – 2021-06-26 (×6): 10 mg via ORAL
  Filled 2021-06-20 (×8): qty 2

## 2021-06-20 MED ORDER — FERROUS SULFATE 325 (65 FE) MG PO TABS
325.0000 mg | ORAL_TABLET | Freq: Every day | ORAL | Status: DC
  Administered 2021-06-21 – 2021-06-27 (×7): 325 mg via ORAL
  Filled 2021-06-20 (×7): qty 1

## 2021-06-20 MED ORDER — METHOCARBAMOL 500 MG IVPB - SIMPLE MED
INTRAVENOUS | Status: AC
  Filled 2021-06-20: qty 50

## 2021-06-20 MED ORDER — DEXAMETHASONE SODIUM PHOSPHATE 10 MG/ML IJ SOLN
INTRAMUSCULAR | Status: AC
  Filled 2021-06-20: qty 1

## 2021-06-20 MED ORDER — FUROSEMIDE 40 MG PO TABS
40.0000 mg | ORAL_TABLET | Freq: Every day | ORAL | Status: DC
  Administered 2021-06-21 – 2021-06-27 (×7): 40 mg via ORAL
  Filled 2021-06-20 (×7): qty 1

## 2021-06-20 MED ORDER — ALLOPURINOL 100 MG PO TABS
200.0000 mg | ORAL_TABLET | Freq: Every day | ORAL | Status: DC
  Administered 2021-06-21 – 2021-06-22 (×2): 200 mg via ORAL
  Filled 2021-06-20 (×2): qty 2

## 2021-06-20 MED ORDER — ACETAMINOPHEN 10 MG/ML IV SOLN
INTRAVENOUS | Status: DC | PRN
  Administered 2021-06-20: 1000 mg via INTRAVENOUS

## 2021-06-20 MED ORDER — HYDRALAZINE HCL 20 MG/ML IJ SOLN
INTRAMUSCULAR | Status: AC
  Administered 2021-06-20: 10 mg via INTRAVENOUS
  Filled 2021-06-20: qty 1

## 2021-06-20 MED ORDER — ORAL CARE MOUTH RINSE: 15.0000 mL | Freq: Once | OROMUCOSAL | Status: AC

## 2021-06-20 MED ORDER — HYDROMORPHONE HCL 1 MG/ML IJ SOLN
INTRAMUSCULAR | Status: DC | PRN
  Administered 2021-06-20 (×2): .5 mg via INTRAVENOUS
  Administered 2021-06-20: 1 mg via INTRAVENOUS

## 2021-06-20 MED ORDER — PROPOFOL 10 MG/ML IV BOLUS
INTRAVENOUS | Status: AC
  Filled 2021-06-20: qty 20

## 2021-06-20 MED ORDER — LIDOCAINE 2% (20 MG/ML) 5 ML SYRINGE
INTRAMUSCULAR | Status: AC
  Filled 2021-06-20: qty 5

## 2021-06-20 MED ORDER — DEXAMETHASONE SODIUM PHOSPHATE 10 MG/ML IJ SOLN
INTRAMUSCULAR | Status: DC | PRN
  Administered 2021-06-20: 10 mg via INTRAVENOUS

## 2021-06-20 MED ORDER — METHOCARBAMOL 500 MG PO TABS: 500.0000 mg | ORAL_TABLET | Freq: Four times a day (QID) | ORAL | 1 refills | Status: AC | PRN

## 2021-06-20 MED ORDER — ENOXAPARIN SODIUM 40 MG/0.4ML IJ SOSY
30.0000 mg | PREFILLED_SYRINGE | Freq: Two times a day (BID) | INTRAMUSCULAR | 0 refills | Status: AC
Start: 2021-06-20 — End: 2021-07-20

## 2021-06-20 MED ORDER — METFORMIN HCL ER 500 MG PO TB24
500.0000 mg | ORAL_TABLET | Freq: Every day | ORAL | Status: DC
  Filled 2021-06-20: qty 1

## 2021-06-20 SURGICAL SUPPLY — 51 items
BAG COUNTER SPONGE SURGICOUNT (BAG) IMPLANT
BAG ZIPLOCK 12X15 (MISCELLANEOUS) ×2 IMPLANT
BANDAGE ESMARK 6X9 LF (GAUZE/BANDAGES/DRESSINGS) IMPLANT
BLADE SURG SZ10 CARB STEEL (BLADE) ×1 IMPLANT
BNDG COHESIVE 6X5 TAN ST LF (GAUZE/BANDAGES/DRESSINGS) ×1 IMPLANT
BNDG ELASTIC 6X5.8 VLCR STR LF (GAUZE/BANDAGES/DRESSINGS) ×1 IMPLANT
BNDG ESMARK 6X9 LF (GAUZE/BANDAGES/DRESSINGS) ×4
BNDG GAUZE ELAST 4 BULKY (GAUZE/BANDAGES/DRESSINGS) ×3 IMPLANT
COVER SURGICAL LIGHT HANDLE (MISCELLANEOUS) ×2 IMPLANT
CUFF TOURN SGL QUICK 34 (TOURNIQUET CUFF) ×1
CUFF TRNQT CYL 34X4.125X (TOURNIQUET CUFF) ×1 IMPLANT
DRAPE C-ARM 42X120 X-RAY (DRAPES) ×2 IMPLANT
DRAPE C-ARMOR (DRAPES) ×1 IMPLANT
DRAPE INCISE 23X17 IOBAN STRL (DRAPES) ×1
DRAPE INCISE 23X17 STRL (DRAPES) IMPLANT
DRAPE INCISE IOBAN 23X17 STRL (DRAPES) ×1 IMPLANT
DRAPE U-SHAPE 47X51 STRL (DRAPES) ×2 IMPLANT
DRSG ADAPTIC 3X8 NADH LF (GAUZE/BANDAGES/DRESSINGS) ×1 IMPLANT
DRSG PAD ABDOMINAL 8X10 ST (GAUZE/BANDAGES/DRESSINGS) ×2 IMPLANT
DURAPREP 26ML APPLICATOR (WOUND CARE) ×2 IMPLANT
ELECT REM PT RETURN 15FT ADLT (MISCELLANEOUS) ×2 IMPLANT
FIBERTAPE 2 W/STRL NDL 17 (SUTURE) ×1 IMPLANT
GAUZE SPONGE 4X4 12PLY STRL (GAUZE/BANDAGES/DRESSINGS) ×1 IMPLANT
GLOVE SURG ORTHO LTX SZ7.5 (GLOVE) ×2 IMPLANT
GLOVE SURG ORTHO LTX SZ8.5 (GLOVE) ×2 IMPLANT
GLOVE SURG UNDER POLY LF SZ7.5 (GLOVE) ×2 IMPLANT
GLOVE SURG UNDER POLY LF SZ8.5 (GLOVE) ×2 IMPLANT
GOWN STRL REUS W/TWL LRG LVL3 (GOWN DISPOSABLE) ×4 IMPLANT
GUIDEWIRE THREADED 150MM (WIRE) ×3 IMPLANT
IMMOBILIZER KNEE 20 (SOFTGOODS) IMPLANT
IMMOBILIZER KNEE 20 THIGH 36 (SOFTGOODS) ×1 IMPLANT
IMMOBILIZER KNEE 24 THIGH 36 (MISCELLANEOUS) IMPLANT
IMMOBILIZER KNEE 24 UNIV (MISCELLANEOUS) ×2 IMPLANT
KIT TURNOVER KIT A (KITS) ×2 IMPLANT
MANIFOLD NEPTUNE II (INSTRUMENTS) ×2 IMPLANT
PACK ORTHO EXTREMITY (CUSTOM PROCEDURE TRAY) ×2 IMPLANT
PADDING CAST SYN 6 (CAST SUPPLIES) ×2
PADDING CAST SYNTHETIC 6X4 NS (CAST SUPPLIES) ×2 IMPLANT
PENCIL SMOKE EVACUATOR (MISCELLANEOUS) IMPLANT
PROTECTOR NERVE ULNAR (MISCELLANEOUS) ×2 IMPLANT
SCREW CANN P.T. 44MM (Screw) ×1 IMPLANT
SCREW CANN P.T. 46MM (Screw) ×1 IMPLANT
STAPLER VISISTAT 35W (STAPLE) ×1 IMPLANT
SUT FIBERWIRE #2 38 T-5 BLUE (SUTURE) ×8
SUT VIC AB 1 CT1 27 (SUTURE) ×1
SUT VIC AB 1 CT1 27XBRD ANTBC (SUTURE) ×4 IMPLANT
SUT VIC AB 2-0 CT1 27 (SUTURE) ×1
SUT VIC AB 2-0 CT1 TAPERPNT 27 (SUTURE) ×2 IMPLANT
SUTURE FIBERWR #2 38 T-5 BLUE (SUTURE) ×3 IMPLANT
TOWEL OR 17X26 10 PK STRL BLUE (TOWEL DISPOSABLE) ×4 IMPLANT
YANKAUER SUCT BULB TIP 10FT TU (MISCELLANEOUS) ×1 IMPLANT

## 2021-06-20 NOTE — Anesthesia Procedure Notes (Signed)
Anesthesia Regional Block: Adductor canal block   Pre-Anesthetic Checklist: , timeout performed,  Correct Patient, Correct Site, Correct Laterality,  Correct Procedure, Correct Position, site marked,  Risks and benefits discussed,  Surgical consent,  Pre-op evaluation,  At surgeon's request and post-op pain management  Laterality: Right  Prep: chloraprep       Needles:  Injection technique: Single-shot  Needle Type: Echogenic Needle     Needle Length: 9cm      Additional Needles:   Procedures:,,,, ultrasound used (permanent image in chart),,    Narrative:  Start time: 06/20/2021 3:16 PM End time: 06/20/2021 3:21 PM Injection made incrementally with aspirations every 5 mL.  Performed by: Personally  Anesthesiologist: Myrtie Soman, MD  Additional Notes: Patient tolerated the procedure well without complications

## 2021-06-20 NOTE — Anesthesia Procedure Notes (Signed)
Anesthesia Procedure Image    

## 2021-06-20 NOTE — Interval H&P Note (Signed)
History and Physical Interval Note:  06/20/2021 2:00 PM  Derek Ibarra  has presented today for surgery, with the diagnosis of RIGHT PATELLA FRACTURE.  The various methods of treatment have been discussed with the patient and family. After consideration of risks, benefits and other options for treatment, the patient has consented to  Procedure(s): OPEN REDUCTION INTERNAL (ORIF) FIXATION PATELLA (Right) as a surgical intervention.  The patient's history has been reviewed, patient examined, no change in status, stable for surgery.  I have reviewed the patient's chart and labs.  Questions were answered to the patient's satisfaction.     Augustin Schooling

## 2021-06-20 NOTE — Brief Op Note (Signed)
06/20/2021  5:45 PM  PATIENT:  Derek Ibarra  65 y.o. male  PRE-OPERATIVE DIAGNOSIS:  RIGHT PATELLA FRACTURE, COMMINUTED AND DISPLACED   POST-OPERATIVE DIAGNOSIS:  RIGHT PATELLA FRACTURE, COMMINUTED AND DISPLACED  PROCEDURE:  Procedure(s): OPEN REDUCTION INTERNAL (ORIF) FIXATION PATELLA (Right) 3.5 cannulated screws and Arthrex Suture Tape tension band  SURGEON:  Surgeon(s) and Role:    Netta Cedars, MD - Primary  PHYSICIAN ASSISTANT:   ASSISTANTS: Ventura Bruns, PA-C   ANESTHESIA:   regional and general  EBL:  minimal  BLOOD ADMINISTERED:none  DRAINS: none   LOCAL MEDICATIONS USED:  NONE  SPECIMEN:  No Specimen  DISPOSITION OF SPECIMEN:  N/A  COUNTS:  YES  TOURNIQUET:   Total Tourniquet Time Documented: Thigh (Right) - 61 minutes Total: Thigh (Right) - 61 minutes   DICTATION: .Other Dictation: Dictation Number [id number]  PLAN OF CARE: Admit for overnight observation  PATIENT DISPOSITION:  PACU - hemodynamically stable.   Delay start of Pharmacological VTE agent (>24hrs) due to surgical blood loss or risk of bleeding: no

## 2021-06-20 NOTE — Anesthesia Preprocedure Evaluation (Signed)
Anesthesia Evaluation  Patient identified by MRN, date of birth, ID band Patient awake    Reviewed: Allergy & Precautions, NPO status , Patient's Chart, lab work & pertinent test results  Airway Mallampati: II  TM Distance: <3 FB Neck ROM: Full    Dental no notable dental hx.    Pulmonary sleep apnea and Continuous Positive Airway Pressure Ventilation , Current Smoker,    Pulmonary exam normal breath sounds clear to auscultation       Cardiovascular hypertension, Normal cardiovascular exam Rhythm:Regular Rate:Normal     Neuro/Psych negative neurological ROS  negative psych ROS   GI/Hepatic negative GI ROS, Neg liver ROS,   Endo/Other  diabetesMorbid obesity  Renal/GU Renal InsufficiencyRenal disease  negative genitourinary   Musculoskeletal negative musculoskeletal ROS (+)   Abdominal   Peds negative pediatric ROS (+)  Hematology negative hematology ROS (+)   Anesthesia Other Findings   Reproductive/Obstetrics negative OB ROS                             Anesthesia Physical Anesthesia Plan  ASA: 3  Anesthesia Plan: General   Post-op Pain Management:  Regional for Post-op pain   Induction: Intravenous  PONV Risk Score and Plan: 1 and Ondansetron, Dexamethasone and Treatment may vary due to age or medical condition  Airway Management Planned: LMA  Additional Equipment:   Intra-op Plan:   Post-operative Plan: Extubation in OR  Informed Consent: I have reviewed the patients History and Physical, chart, labs and discussed the procedure including the risks, benefits and alternatives for the proposed anesthesia with the patient or authorized representative who has indicated his/her understanding and acceptance.     Dental advisory given  Plan Discussed with: CRNA and Surgeon  Anesthesia Plan Comments:         Anesthesia Quick Evaluation

## 2021-06-20 NOTE — Transfer of Care (Signed)
Immediate Anesthesia Transfer of Care Note  Patient: Derek Ibarra  Procedure(s) Performed: Procedure(s): OPEN REDUCTION INTERNAL (ORIF) FIXATION PATELLA (Right)  Patient Location: PACU  Anesthesia Type:General  Level of Consciousness: Alert, Awake, Oriented  Airway & Oxygen Therapy: Patient Spontanous Breathing  Post-op Assessment: Report given to RN  Post vital signs: Reviewed and stable  Last Vitals:  Vitals:   06/20/21 1546 06/20/21 1547  BP:    Pulse: 80 79  Resp: 17 16  Temp:    SpO2: 123XX123 [id number]    Complications: No apparent anesthesia complications

## 2021-06-20 NOTE — Anesthesia Procedure Notes (Signed)
Procedure Name: LMA Insertion Date/Time: 06/20/2021 4:00 PM Performed by: Gerald Leitz, CRNA Pre-anesthesia Checklist: Patient identified, Patient being monitored, Timeout performed, Emergency Drugs available and Suction available Patient Re-evaluated:Patient Re-evaluated prior to induction Oxygen Delivery Method: Circle system utilized Preoxygenation: Pre-oxygenation with 100% oxygen Induction Type: IV induction Ventilation: Mask ventilation without difficulty LMA: LMA inserted and LMA with gastric port inserted LMA Size: 5.0 Tube type: Oral Number of attempts: 1 Placement Confirmation: positive ETCO2 and breath sounds checked- equal and bilateral Tube secured with: Tape Dental Injury: Teeth and Oropharynx as per pre-operative assessment

## 2021-06-20 NOTE — Discharge Instructions (Signed)
Ice to the knee constantly.  Keep the incision covered and clean and dry for one week, then ok to get it wet in the shower.Be careful getting in the shower, only remove the brace once seated and the leg is propped straight.   Do ankle pumps constantly to encourage circulation.     Prop under the ankle to encourage your knee to go straight and for elevation.  Keep your immobilizer pulled up high on your thigh, centered on the knee.  Use the walker while you are up and around for balance. PLace minimal weight on the leg with the brace on, just enough for balance.  Wear your support stockings 24/7 to prevent blood clots and inject Lovenox twice daily for 30 days also to prevent blood clots  Follow up with Dr Veverly Fells in two weeks in the office, call [phone] for appt

## 2021-06-20 NOTE — Progress Notes (Signed)
Assisted Dr. Rose with right, ultrasound guided, adductor canal block. Side rails up, monitors on throughout procedure. See vital signs in flow sheet. Tolerated Procedure well.  

## 2021-06-21 ENCOUNTER — Other Ambulatory Visit: Payer: Self-pay

## 2021-06-21 LAB — CBC
HCT: 34 % — ABNORMAL LOW (ref 39.0–52.0)
Hemoglobin: 10.9 g/dL — ABNORMAL LOW (ref 13.0–17.0)
MCH: 29 pg (ref 26.0–34.0)
MCHC: 32.1 g/dL (ref 30.0–36.0)
MCV: 90.4 fL (ref 80.0–100.0)
Platelets: 277 10*3/uL (ref 150–400)
RBC: 3.76 MIL/uL — ABNORMAL LOW (ref 4.22–5.81)
RDW: 14.8 % (ref 11.5–15.5)
WBC: 15.3 10*3/uL — ABNORMAL HIGH (ref 4.0–10.5)
nRBC: 0 % (ref 0.0–0.2)

## 2021-06-21 LAB — BASIC METABOLIC PANEL
Anion gap: 10 (ref 5–15)
BUN: 58 mg/dL — ABNORMAL HIGH (ref 8–23)
CO2: 22 mmol/L (ref 22–32)
Calcium: 9.8 mg/dL (ref 8.9–10.3)
Chloride: 113 mmol/L — ABNORMAL HIGH (ref 98–111)
Creatinine, Ser: 3 mg/dL — ABNORMAL HIGH (ref 0.61–1.24)
GFR, Estimated: 22 mL/min — ABNORMAL LOW (ref >60)
Glucose, Bld: 247 mg/dL — ABNORMAL HIGH (ref 70–99)
Potassium: 5.4 mmol/L — ABNORMAL HIGH (ref 3.5–5.1)
Sodium: 145 mmol/L (ref 135–145)

## 2021-06-21 LAB — GLUCOSE, CAPILLARY
Glucose-Capillary: 257 mg/dL — ABNORMAL HIGH (ref 70–99)
Glucose-Capillary: 175 mg/dL — ABNORMAL HIGH (ref 70–99)
Glucose-Capillary: 118 mg/dL — ABNORMAL HIGH (ref 70–99)
Glucose-Capillary: 167 mg/dL — ABNORMAL HIGH (ref 70–99)

## 2021-06-21 LAB — HEMOGLOBIN A1C
Hgb A1c MFr Bld: 7.2 % — ABNORMAL HIGH (ref 4.8–5.6)
Hgb A1c MFr Bld: 7.3 % — ABNORMAL HIGH (ref 4.8–5.6)
Mean Plasma Glucose: 159.94 mg/dL
Mean Plasma Glucose: 162.81 mg/dL

## 2021-06-21 MED ORDER — LISINOPRIL 20 MG PO TABS
40.0000 mg | ORAL_TABLET | Freq: Every day | ORAL | Status: DC
  Filled 2021-06-21: qty 2

## 2021-06-21 MED ORDER — HYDRALAZINE HCL 20 MG/ML IJ SOLN
10.0000 mg | Freq: Once | INTRAMUSCULAR | Status: AC
  Administered 2021-06-21: 10 mg via INTRAVENOUS
  Filled 2021-06-21: qty 1

## 2021-06-21 MED ORDER — HYDRALAZINE HCL 20 MG/ML IJ SOLN
10.0000 mg | Freq: Four times a day (QID) | INTRAMUSCULAR | Status: DC | PRN
  Administered 2021-06-21: 10 mg via INTRAVENOUS
  Filled 2021-06-21 (×3): qty 1

## 2021-06-21 MED ORDER — ORAL CARE MOUTH RINSE
15.0000 mL | Freq: Two times a day (BID) | OROMUCOSAL | Status: DC
  Administered 2021-06-21 – 2021-06-27 (×12): 15 mL via OROMUCOSAL

## 2021-06-21 MED ORDER — METFORMIN HCL ER 500 MG PO TB24
500.0000 mg | ORAL_TABLET | Freq: Every day | ORAL | Status: DC
  Filled 2021-06-21: qty 1

## 2021-06-21 NOTE — Anesthesia Postprocedure Evaluation (Signed)
Anesthesia Post Note  Patient: Derek Ibarra  Procedure(s) Performed: OPEN REDUCTION INTERNAL (ORIF) FIXATION PATELLA (Right: Knee)     Patient location during evaluation: PACU Anesthesia Type: General Level of consciousness: awake and alert Pain management: pain level controlled Vital Signs Assessment: post-procedure vital signs reviewed and stable Respiratory status: spontaneous breathing, nonlabored ventilation, respiratory function stable and patient connected to nasal cannula oxygen Cardiovascular status: blood pressure returned to baseline and stable Postop Assessment: no apparent nausea or vomiting Anesthetic complications: no   No notable events documented.  Last Vitals:  Vitals:   06/21/21 1421 06/21/21 1836  BP: (!) 190/90 (!) 174/85  Pulse: (!) 101 96  Resp: 16   Temp: 37.1 C   SpO2: 97%     Last Pain:  Vitals:   06/21/21 1442  TempSrc:   PainSc: Asleep                 Catalina Gravel

## 2021-06-21 NOTE — Progress Notes (Addendum)
Last 2 BP for patient were 186/88 and 187/94. I asked patient if he was in pain and he stated only mildly in his feet and he stated that he didn't want pain medication. He stated that his last BP was high d/t me startling him by waking him up. Notified Tracey Shuford, PA d/t patient not having any PRN BP medication. She stated to keep monitoring him for pain and if his next SBP was greater than 175 then to give him a one-time dose of '10mg'$  IV hydrazine. Will continue to monitor.   626 - BP 187/89; therefore, new one-time order for '10mg'$  IV hydralazine per Jenetta Loges, PA ordered and given. Will continue to monitor.

## 2021-06-21 NOTE — Progress Notes (Signed)
Added PRN Hydralazine IV due to persistently elevated BP. Per pharmacy recommendations I have held lisinopril and metformin due to elevated creatinine and potassium. They are scheduled to restart on 06/23/21 unless labs do not improve then would continue to hold these medications.

## 2021-06-21 NOTE — Progress Notes (Signed)
Just called [facility] answering service to report patient's continued high BP. Received call back from Dr. Victorino December, MD indicating that he is fine with patient receiving both oral Hydralazine at the scheduled times and PRN IV Hydralazine as needed for high BP. Patient reports no complaints of chest pain. Just medicated patient for right knee pain. Patient will continue to be monitored.

## 2021-06-21 NOTE — Progress Notes (Signed)
   Subjective:  Patient reports pain as mild.  No events overnigth.  No complaints of CP or SOB.    Objective:   VITALS:   Vitals:   06/20/21 2316 06/21/21 0058 06/21/21 0325 06/21/21 0626  BP: (!) 162/78 (!) 186/88 (!) 187/94 (!) 187/89  Pulse: 92 96 (!) 101 98  Resp: '16 16 20 16  '$ Temp: 97.8 F (36.6 C) 97.8 F (36.6 C) 98.3 F (36.8 C) 98.6 F (37 C)  TempSrc: Oral Oral Oral Oral  SpO2: 100% 99% 99% 99%  Weight:      Height:        Neurologically intact Neurovascular intact Sensation intact distally Intact pulses distally Dorsiflexion/Plantar flexion intact KI and dressing in place  Lab Results  Component Value Date   WBC 15.3 (H) 06/21/2021   HGB 10.9 (L) 06/21/2021   HCT 34.0 (L) 06/21/2021   MCV 90.4 06/21/2021   PLT 277 06/21/2021   BMET    Component Value Date/Time   NA 145 06/21/2021 0329   K 5.4 (H) 06/21/2021 0329   CL 113 (H) 06/21/2021 0329   CO2 22 06/21/2021 0329   GLUCOSE 247 (H) 06/21/2021 0329   BUN 58 (H) 06/21/2021 0329   CREATININE 3.00 (H) 06/21/2021 0329   CALCIUM 9.8 06/21/2021 0329   GFRNONAA 22 (L) 06/21/2021 0329   GFRAA 32 (L) 10/17/2019 0430     Assessment/Plan: 1 Day Post-Op   Active Problems:   Right patella fracture   Up with therapy - PWB to RLE, essentially TDWB for balance - lovenox and scds for dvt ppx - dc when cleared by PT.  Pt does have stairs at home and lives upstairs.  Follow up 2 weeks with Colin Mulders 06/21/2021, 7:39 AM   Geralynn Rile, MD [phone]

## 2021-06-21 NOTE — Op Note (Signed)
NAMEShayde, Garcia [facility] MEDICAL RECORD NO: [id number] ACCOUNT NO: [id number] DATE OF BIRTH: 1956-09-07 FACILITY: [facility] LOCATION: [facility] PHYSICIAN: Doran Heater. Veverly Fells, MD  Operative Report   DATE OF PROCEDURE: 06/20/2021   PREOPERATIVE DIAGNOSIS:  Right displaced and comminuted patellar fracture.  POSTOPERATIVE DIAGNOSIS:  Right displaced and comminuted patellar fracture.  PROCEDURE PERFORMED:  ORIF right patellar fracture using 3.5 cannulated screws and Arthrex SutureTape with tension band technique.  ATTENDING SURGEON:  Dr. Esmond Plants.  ASSISTANT:  Darol Destine, [place], who was scrubbed for the entire procedure, and necessary for satisfactory completion of the surgery.  ANESTHESIA:  General anesthesia was used plus local block.  ESTIMATED BLOOD LOSS:  Minimal.  FLUID REPLACEMENT:  1000 mL crystalloid and instrument counts were correct.  There were no complications.  Perioperative antibiotics were given.  TOURNIQUET TIME:  1 hour.  INDICATIONS:  The patient is a 65 year old male with a history of a fall injuring his right knee a week ago.  The patient presented to the emergency room initially and then ultimately to outpatient Orthopedics yesterday with a displaced patellar  fracture, unable to extend his knee against gravity or against resistance and unable to walk.  We discussed options with the patient, recommending ORIF of his patella to restore his extensor mechanism.  The patient agreed with this.  The patient did have  a CT scan showing displacement.  Informed consent obtained.  DESCRIPTION OF PROCEDURE:  After an adequate level of anesthesia was achieved, the patient was positioned in the supine position.  A nonsterile tourniquet was placed in the proximal thigh.  Right leg sterilely prepped and draped in the usual manner.   Timeout called, verifying correct patient, correct site.  We elevated the leg, exsanguinated with an Esmarch bandage and then used a longitudinal midline  incision with a 10 blade scalpel.  Dissection down through subcutaneous tissues identified the  fractured patella.  There was a large hematoma that was evacuated.  There was about a centimeter of displacement at the fracture site.  We were able to align the fracture under C-arm and also by direct visualization and palpation underneath the patella  and placed 2 cannulated screws.  There was comminution at the fracture site, but we were able to place two cannulated screw guidewires in the appropriate position.  We then placed the appropriately length 3.5 cannulated screws across the fracture site  getting good compression.  On the lateral side, when the fracture compressed it seemed like the distal fragment translated slightly posteriorly relative to the proximal fragment.  This was felt to be 1 mm or so and was also felt to be due to the  comminution and it felt perfect on the medial side and we felt like this was acceptable that if we tried to reshoot that screw and recompress so we may not get the same type of bony apposition and compression and this looked anatomically correct on the  AP just slightly translated on the lateral, so we accepted this reduction.  We had good interdigitation of the bone and then we used the Arthrex SutureTape in a tension band technique.  It came along a nitinol needle.  We then introduced it through the  medial screw [place] to [place] and then came back over in a zigzag fashion and then went back up to the lateral screw [place] to [place] and tied over the top in a tension band technique.  We ranged the knee 0-30, no gapping at all and we got our  final x-rays  and felt like we had near anatomic reduction.  Definitely better apposition of bone-to-bone.  We then repaired the retinaculum on each side with #2 FiberWire suture about 3 or 4 sutures on the medial side and 3 or 4 sutures on the lateral side including  some figure-of-eight and had very good compression of the soft tissue  laterally and medially.  At this point, we placed the knee in hyperextension.  We irrigated thoroughly and repaired the subcutaneous tissues with 2-0 Vicryl and 0 Vicryl and then  staples for skin.  Sterile bandages were applied followed by knee immobilizer.  Patient transported to recovery room in stable condition, having tolerated surgery well.     SUJ D: 06/20/2021 5:51:47 pm T: 06/21/2021 2:06:00 am  JOB: [id number]

## 2021-06-21 NOTE — Progress Notes (Signed)
Left voicemail to provider on call about patient's elevated BP and patient's latest potassium number of 5.4. Held the AM oral potassium. Made on call provider aware. Waiting for further orders.

## 2021-06-21 NOTE — Evaluation (Signed)
Occupational Therapy Evaluation Patient Details Name: Derek Ibarra MRN: [id number] DOB: [birth year] Today's Date: 06/21/2021    History of Present Illness Patient s/p right patella fracture repair.   Clinical Impression   Derek Ibarra is a 65 year old man s/p right patella fracture repair who presents in right knee immobilizer (at all times) with TDWB status with decreased ROM and strength of RLE, impaired balance and complaints of pain limiting mobility and ADLs. Patient supervision for supine to sit but min assist to st and and steady with RW. Patient only able to take a couple of steps - with difficulty maintaining weight bearing status before reporting nausea and wanting to sit. Patient needed assistance to extend log with sit to stands. Significant assistance needed for LB ADLs. Patient will benefit from skilled OT services while in hospital to improve deficits and learn compensatory strategies as needed in order to improve functional abilities.   Follow Up Recommendations  Home health OT    Equipment Recommendations  Other (comment) (BSC (wide))    Recommendations for Other Services       Precautions / Restrictions Precautions Precautions: Fall Required Braces or Orthoses: Knee Immobilizer - Right Knee Immobilizer - Right: On at all times Restrictions Weight Bearing Restrictions: Yes RLE Weight Bearing: Touchdown weight bearing      Mobility Bed Mobility Overal bed mobility: Needs Assistance Bed Mobility: Supine to Sit     Supine to sit: Supervision          Transfers Overall transfer level: Needs assistance Equipment used: Rolling walker (2 wheeled) Transfers: Sit to/from Stand Sit to Stand: Min assist;From elevated surface         General transfer comment: Increased time and steadying assist to rise with walker. Min assist to manage LE with sit to stand.    Balance Overall balance assessment: Needs assistance Sitting-balance support: No upper extremity  supported Sitting balance-Leahy Scale: Normal     Standing balance support: During functional activity Standing balance-Leahy Scale: Fair                             ADL either performed or assessed with clinical judgement   ADL Overall ADL's : Needs assistance/impaired Eating/Feeding: Independent   Grooming: Set up;Sitting   Upper Body Bathing: Set up;Sitting   Lower Body Bathing: Moderate assistance;Sit to/from stand   Upper Body Dressing : Set up;Sitting   Lower Body Dressing: Maximal assistance;Sit to/from stand   Toilet Transfer: Moderate assistance;RW;BSC;+2 for safety/equipment   Toileting- Clothing Manipulation and Hygiene: Moderate assistance;Sit to/from stand       Functional mobility during ADLs: Minimal assistance;Rolling walker       Vision Patient Visual Report: No change from baseline       Perception     Praxis      Pertinent Vitals/Pain Pain Assessment: 0-10 Pain Score: 5  Pain Descriptors / Indicators: Grimacing Pain Intervention(s): Limited activity within patient's tolerance;Monitored during session     Hand Dominance Right   Extremity/Trunk Assessment Upper Extremity Assessment Upper Extremity Assessment: Overall WFL for tasks assessed (WFL ROM, 5/5 strength)   Lower Extremity Assessment Lower Extremity Assessment: Defer to PT evaluation   Cervical / Trunk Assessment Cervical / Trunk Assessment: Normal   Communication Communication Communication: No difficulties   Cognition Arousal/Alertness: Awake/alert Behavior During Therapy: WFL for tasks assessed/performed Overall Cognitive Status: Within Functional Limits for tasks assessed  General Comments       Exercises     Shoulder Instructions      Home Living Family/patient expects to be discharged to:: Private residence Living Arrangements: Spouse/significant other Available Help at Discharge: Family;Available  24 hours/day Type of Home: Other(Comment) (townhome) Home Access: Level entry     Home Layout: Two level;1/2 bath on main level         Bathroom Toilet: Standard     Home Equipment: None   Additional Comments: Has stairlift to second floor where bedrooms are - but unable to maintain knee extesion during use      Prior Functioning/Environment Level of Independence: Independent                 OT Problem List: Decreased strength;Decreased range of motion;Decreased activity tolerance;Impaired balance (sitting and/or standing);Decreased knowledge of use of DME or AE;Decreased knowledge of precautions;Pain;Obesity      OT Treatment/Interventions: Self-care/ADL training;Therapeutic exercise;DME and/or AE instruction;Therapeutic activities;Balance training;Patient/family education    OT Goals(Current goals can be found in the care plan section) Acute Rehab OT Goals Patient Stated Goal: to improve functional tasks in order to go home OT Goal Formulation: With patient Time For Goal Achievement: 07/05/21 Potential to Achieve Goals: Good  OT Frequency: Min 2X/week   Barriers to D/C:            Co-evaluation PT/OT/SLP Co-Evaluation/Treatment: Yes Reason for Co-Treatment: To address functional/ADL transfers;For patient/therapist safety (co-eval)          AM-PAC OT "6 Clicks" Daily Activity     Outcome Measure Help from another person eating meals?: None Help from another person taking care of personal grooming?: A Little Help from another person toileting, which includes using toliet, bedpan, or urinal?: A Lot Help from another person bathing (including washing, rinsing, drying)?: A Lot Help from another person to put on and taking off regular upper body clothing?: A Little Help from another person to put on and taking off regular lower body clothing?: A Lot 6 Click Score: 16   End of Session Equipment Utilized During Treatment: Rolling walker;Gait belt Nurse  Communication: Mobility status  Activity Tolerance: Patient tolerated treatment well Patient left: in chair;with call bell/phone within reach;with chair alarm set  OT Visit Diagnosis: Other abnormalities of gait and mobility (R26.89);Unsteadiness on feet (R26.81);Pain                Time: [id number] OT Time Calculation (min): 37 min Charges:  OT General Charges $OT Visit: 1 Visit OT Evaluation $OT Eval Low Complexity: 1 Low  Adhya Cocco, OTR/L [place]  Office [phone] Pager: [place] 06/21/2021, 11:00 AM

## 2021-06-21 NOTE — Evaluation (Signed)
Physical Therapy Evaluation Patient Details Name: Derek Ibarra MRN: [id number] DOB: [birth year] Today's Date: 06/21/2021   History of Present Illness  Patient is s/p ORIF of right patellar fracture. PMH significant for DM, gout, HTN, Lt humeral frature repair (2021).  Clinical Impression  Pt is POD #1 s/p ORIF of Rt patella fracture resulting in the deficits listed below (see PT Problem List). Pt performed sit to stand transfers with MIN A for power up and safety with cues for safe hand placement. Pt ambulated ~76f and demonstrated difficulty maintaining TDWB precautions throughout session, despite repeated multimodal cuing. Pt was limited with further mobility by onset of nausea. Pt will benefit from skilled PT to maximize functional mobility to increase independence and for continued assessment of safety with mobility and reinforcement of TDWB precautions.      Follow Up Recommendations Home health PT;Supervision for mobility/OOB    Equipment Recommendations  Rolling walker with 5" wheels    Recommendations for Other Services       Precautions / Restrictions Precautions Precautions: Fall Required Braces or Orthoses: Knee Immobilizer - Right Knee Immobilizer - Right: On at all times Restrictions Weight Bearing Restrictions: Yes RLE Weight Bearing: Touchdown weight bearing      Mobility  Bed Mobility Overal bed mobility: Needs Assistance Bed Mobility: Supine to Sit     Supine to sit: Supervision     General bed mobility comments: HOB elevated, supervision for safety with increased time to complete    Transfers Overall transfer level: Needs assistance Equipment used: Rolling walker (2 wheeled) Transfers: Sit to/from Stand Sit to Stand: Min assist;From elevated surface         General transfer comment: x2 from EOB and recliner; Review/demonstration of TDWB restrictions prior to transfers. MIN A for power up to stand with cues for safe hand placement with use of RW.  Increased time to rise. Repeated cues provided throughout session for adherence to TDWB precautions on Rt LE. MIN A to bring Rt LE forward with stand to sit transfer to avoid knee flexion when sitting.  Ambulation/Gait Ambulation/Gait assistance: Min assist Gait Distance (Feet): 4 Feet Assistive device: Rolling walker (2 wheeled) Gait Pattern/deviations: Step-to pattern;Decreased stride length;Decreased weight shift to right;Shuffle;Decreased step length - left;Decreased step length - right     General Gait Details: Pt with difficulty sequencing gait and adhering to TDWB precautions of Rt LE despite multimodal cues provided by therapist. Pt seeming to have difficulty with command following/processing, shuffling Lt LE and requiring repeated cues to maintain TDWB on Rt LE. Pt limited by onset of nausea and assisted to recliner. BP 204/1030mg while seated in recliner, suspect unreliable reading as pt was clincing arm that BP was being taken in. Pt performed sit to stand from recliner, BP reading 185/8595m. Pt reported resolution of symptoms while seated in recliner, RN aware.  Stairs            Wheelchair Mobility    Modified Rankin (Stroke Patients Only)       Balance Overall balance assessment: Needs assistance Sitting-balance support: No upper extremity supported Sitting balance-Leahy Scale: Normal     Standing balance support: Bilateral upper extremity supported;During functional activity Standing balance-Leahy Scale: Poor Standing balance comment: use of RW for standing balance with adherence to TDWB precautions.                             Pertinent Vitals/Pain Pain Assessment: 0-10 Pain Score: 5  Pain  Location: Rt knee Pain Descriptors / Indicators: Grimacing Pain Intervention(s): Limited activity within patient's tolerance;Monitored during session;Repositioned    Home Living Family/patient expects to be discharged to:: Private residence Living  Arrangements: Spouse/significant other Available Help at Discharge: Family;Available 24 hours/day Type of Home: Other(Comment) (townhome) Home Access: Level entry     Home Layout: Two level;1/2 bath on main level Home Equipment: None Additional Comments: Has stairlift to second floor where bedrooms are - but unable to maintain knee extesion during use    Prior Function Level of Independence: Independent               Hand Dominance   Dominant Hand: Right    Extremity/Trunk Assessment   Upper Extremity Assessment Upper Extremity Assessment: Defer to OT evaluation    Lower Extremity Assessment Lower Extremity Assessment: RLE deficits/detail RLE Deficits / Details: Pt with good DF/PF strength, fair quad set strength. LLE strength WNL observed through functional mobility RLE: Unable to fully assess due to immobilization;Unable to fully assess due to pain    Cervical / Trunk Assessment Cervical / Trunk Assessment: Normal  Communication   Communication: No difficulties  Cognition Arousal/Alertness: Awake/alert Behavior During Therapy: WFL for tasks assessed/performed Overall Cognitive Status: Within Functional Limits for tasks assessed                                        General Comments      Exercises     Assessment/Plan    PT Assessment Patient needs continued PT services  PT Problem List Decreased strength;Decreased range of motion;Decreased activity tolerance;Decreased balance;Decreased mobility;Decreased knowledge of use of DME;Pain       PT Treatment Interventions DME instruction;Gait training;Functional mobility training;Therapeutic activities;Therapeutic exercise;Balance training;Patient/family education;Stair training    PT Goals (Current goals can be found in the Care Plan section)  Acute Rehab PT Goals Patient Stated Goal: to improve functional tasks in order to go home PT Goal Formulation: With patient Time For Goal Achievement:  07/05/21 Potential to Achieve Goals: Good    Frequency 7X/week   Barriers to discharge        Co-evaluation PT/OT/SLP Co-Evaluation/Treatment: Yes Reason for Co-Treatment: For patient/therapist safety;To address functional/ADL transfers PT goals addressed during session: Mobility/safety with mobility;Proper use of DME         AM-PAC PT "6 Clicks" Mobility  Outcome Measure Help needed turning from your back to your side while in a flat bed without using bedrails?: A Little Help needed moving from lying on your back to sitting on the side of a flat bed without using bedrails?: A Little Help needed moving to and from a bed to a chair (including a wheelchair)?: A Little Help needed standing up from a chair using your arms (e.g., wheelchair or bedside chair)?: A Little Help needed to walk in hospital room?: A Little Help needed climbing 3-5 steps with a railing? : A Lot 6 Click Score: 17    End of Session Equipment Utilized During Treatment: Gait belt Activity Tolerance: Patient limited by fatigue;Treatment limited secondary to medical complications (Comment) (onset of nausea) Patient left: in chair;with call bell/phone within reach;with chair alarm set Nurse Communication: Mobility status (pt symptoms and vitals during session) PT Visit Diagnosis: Pain;Muscle weakness (generalized) (M62.81);Difficulty in walking, not elsewhere classified (R26.2) Pain - Right/Left: Right Pain - part of body: Knee    Time: [id number] PT Time Calculation (min) (ACUTE ONLY):  31 min   Charges:   PT Evaluation $PT Eval Low Complexity: 1 Low          Festus Barren PT, DPT  Acute Rehabilitation Services  Office [phone]  06/21/2021, 4:15 PM

## 2021-06-21 NOTE — Plan of Care (Signed)
  Problem: Education: Goal: Knowledge of General Education information will improve Description Including pain rating scale, medication(s)/side effects and non-pharmacologic comfort measures Outcome: Progressing   

## 2021-06-22 DIAGNOSIS — N189 Chronic kidney disease, unspecified: Secondary | ICD-10-CM

## 2021-06-22 DIAGNOSIS — R7989 Other specified abnormal findings of blood chemistry: Secondary | ICD-10-CM | POA: Diagnosis not present

## 2021-06-22 DIAGNOSIS — N184 Chronic kidney disease, stage 4 (severe): Secondary | ICD-10-CM | POA: Diagnosis present

## 2021-06-22 DIAGNOSIS — Z20822 Contact with and (suspected) exposure to covid-19: Secondary | ICD-10-CM | POA: Diagnosis present

## 2021-06-22 DIAGNOSIS — I5032 Chronic diastolic (congestive) heart failure: Secondary | ICD-10-CM

## 2021-06-22 DIAGNOSIS — E109 Type 1 diabetes mellitus without complications: Secondary | ICD-10-CM

## 2021-06-22 DIAGNOSIS — N179 Acute kidney failure, unspecified: Secondary | ICD-10-CM

## 2021-06-22 DIAGNOSIS — Z794 Long term (current) use of insulin: Secondary | ICD-10-CM | POA: Diagnosis not present

## 2021-06-22 DIAGNOSIS — I130 Hypertensive heart and chronic kidney disease with heart failure and stage 1 through stage 4 chronic kidney disease, or unspecified chronic kidney disease: Secondary | ICD-10-CM | POA: Diagnosis present

## 2021-06-22 DIAGNOSIS — D75A Glucose-6-phosphate dehydrogenase (G6PD) deficiency without anemia: Secondary | ICD-10-CM | POA: Diagnosis present

## 2021-06-22 DIAGNOSIS — E11649 Type 2 diabetes mellitus with hypoglycemia without coma: Secondary | ICD-10-CM | POA: Diagnosis present

## 2021-06-22 DIAGNOSIS — S82041A Displaced comminuted fracture of right patella, initial encounter for closed fracture: Secondary | ICD-10-CM | POA: Diagnosis present

## 2021-06-22 DIAGNOSIS — F17210 Nicotine dependence, cigarettes, uncomplicated: Secondary | ICD-10-CM | POA: Diagnosis present

## 2021-06-22 DIAGNOSIS — Z882 Allergy status to sulfonamides status: Secondary | ICD-10-CM | POA: Diagnosis not present

## 2021-06-22 DIAGNOSIS — Z6838 Body mass index (BMI) 38.0-38.9, adult: Secondary | ICD-10-CM | POA: Diagnosis not present

## 2021-06-22 DIAGNOSIS — I10 Essential (primary) hypertension: Secondary | ICD-10-CM | POA: Diagnosis not present

## 2021-06-22 DIAGNOSIS — G4733 Obstructive sleep apnea (adult) (pediatric): Secondary | ICD-10-CM | POA: Diagnosis present

## 2021-06-22 DIAGNOSIS — W19XXXA Unspecified fall, initial encounter: Secondary | ICD-10-CM | POA: Diagnosis present

## 2021-06-22 DIAGNOSIS — Z7984 Long term (current) use of oral hypoglycemic drugs: Secondary | ICD-10-CM | POA: Diagnosis not present

## 2021-06-22 DIAGNOSIS — Z9989 Dependence on other enabling machines and devices: Secondary | ICD-10-CM

## 2021-06-22 DIAGNOSIS — Z880 Allergy status to penicillin: Secondary | ICD-10-CM | POA: Diagnosis not present

## 2021-06-22 DIAGNOSIS — M109 Gout, unspecified: Secondary | ICD-10-CM | POA: Diagnosis present

## 2021-06-22 DIAGNOSIS — S82001A Unspecified fracture of right patella, initial encounter for closed fracture: Secondary | ICD-10-CM

## 2021-06-22 DIAGNOSIS — Z79899 Other long term (current) drug therapy: Secondary | ICD-10-CM | POA: Diagnosis not present

## 2021-06-22 LAB — BASIC METABOLIC PANEL
Anion gap: 11 (ref 5–15)
BUN: 45 mg/dL — ABNORMAL HIGH (ref 8–23)
CO2: 23 mmol/L (ref 22–32)
Calcium: 9.8 mg/dL (ref 8.9–10.3)
Chloride: 110 mmol/L (ref 98–111)
Creatinine, Ser: 2.71 mg/dL — ABNORMAL HIGH (ref 0.61–1.24)
GFR, Estimated: 25 mL/min — ABNORMAL LOW (ref >60)
Glucose, Bld: 131 mg/dL — ABNORMAL HIGH (ref 70–99)
Potassium: 4.3 mmol/L (ref 3.5–5.1)
Sodium: 144 mmol/L (ref 135–145)

## 2021-06-22 LAB — CREATININE, SERUM
Creatinine, Ser: 2.68 mg/dL — ABNORMAL HIGH (ref 0.61–1.24)
GFR, Estimated: 25 mL/min — ABNORMAL LOW (ref >60)

## 2021-06-22 LAB — GLUCOSE, CAPILLARY
Glucose-Capillary: 139 mg/dL — ABNORMAL HIGH (ref 70–99)
Glucose-Capillary: 190 mg/dL — ABNORMAL HIGH (ref 70–99)
Glucose-Capillary: 137 mg/dL — ABNORMAL HIGH (ref 70–99)
Glucose-Capillary: 167 mg/dL — ABNORMAL HIGH (ref 70–99)

## 2021-06-22 LAB — CBC
HCT: 34.1 % — ABNORMAL LOW (ref 39.0–52.0)
Hemoglobin: 10.8 g/dL — ABNORMAL LOW (ref 13.0–17.0)
MCH: 28.6 pg (ref 26.0–34.0)
MCHC: 31.7 g/dL (ref 30.0–36.0)
MCV: 90.5 fL (ref 80.0–100.0)
Platelets: 286 10*3/uL (ref 150–400)
RBC: 3.77 MIL/uL — ABNORMAL LOW (ref 4.22–5.81)
RDW: 15 % (ref 11.5–15.5)
WBC: 14.6 10*3/uL — ABNORMAL HIGH (ref 4.0–10.5)
nRBC: 0 % (ref 0.0–0.2)

## 2021-06-22 MED ORDER — ALLOPURINOL 100 MG PO TABS
100.0000 mg | ORAL_TABLET | Freq: Every day | ORAL | Status: DC
  Administered 2021-06-23 – 2021-06-27 (×5): 100 mg via ORAL
  Filled 2021-06-22 (×6): qty 1

## 2021-06-22 MED ORDER — CLONIDINE HCL 0.1 MG PO TABS
0.2000 mg | ORAL_TABLET | Freq: Two times a day (BID) | ORAL | Status: DC
  Administered 2021-06-22 – 2021-06-27 (×11): 0.2 mg via ORAL
  Filled 2021-06-22 (×11): qty 2

## 2021-06-22 MED ORDER — HYDRALAZINE HCL 25 MG PO TABS: 25.0000 mg | ORAL_TABLET | Freq: Four times a day (QID) | ORAL | Status: DC | PRN

## 2021-06-22 NOTE — Consult Note (Signed)
Medical Consultation   Derek Ibarra  [id number]  DOB: 10-24-1955  DOA: 06/20/2021  PCP: [facility] [place]   Outpatient Specialists: Nephrology, cardiology and ophthalmology at [facility]   Requesting physician: Dr. Wiliam Ke  Reason for consultation: Hypertension/medical management   History of Present Illness: Derek Ibarra is an 65 y.o. male with PMH of diastolic CHF, IDDM-2, CKD-4, OSA on CPAP, resistant hypertension, morbid obesity and gout admitted by orthopedic surgery for right patellar fracture.  Patient was walking out in the hallway from his ophthalmology visit at [facility], when he fell on his knees.  He says his knees locked up leading to his fall.  No prodromes leading to fall.  He presented to orthopedic surgery about a week later, and admitted for surgical fixation.  He denies chest pain, palpitation, dyspnea, GI, UTI focal neuro symptoms leading to this.  He denies hitting his head or loss of consciousness.  He reports good compliance with CPAP.  He smokes "less than a pack a day".  He denies drinking alcohol recreational drug use.  He denies exertional dyspnea, chest pain, orthopnea or PND at baseline.  He reports good compliance with his medication.  However, he states his blood pressure has been running high lately due to some stress.  He says his SBP runs in 170s.  He reports good compliance with his CPAP as well.  Patient underwent ORIF on 06/20/2021.  SBP in the range of 140s to 190s.  DBP in the range of 70s to 105.  Latest BP 142/105.  Blood pressure improved after resuming home medications.  Cr 3.39 on admission and improved to 2.68.  BUN 61>> 45.  Hgb stable at 11.  A1c 7.3%.   Review of Systems:  ROS All review of system negative except for pertinent positives and negatives as history of present illness above.  Past Medical History: Past Medical History:  Diagnosis Date   Arthritis    Diabetes mellitus without complication ([place])    Dyspnea     G6PD deficiency    Gout    Hypertension    Hypertensive heart disease    Morbid obesity ([place])    OSA on CPAP    16 CM pressure   Sleep apnea     Past Surgical History: Past Surgical History:  Procedure Laterality Date   CATARACT EXTRACTION Left 2022   VA in [place] Left 2021   VA in [place] ARCUATE LIGAMENT RELEASE Bilateral 1980     Allergies:   Allergies  Allergen Reactions   Penicillins Rash   Sulfa Antibiotics Rash     Social History:  reports that he has been smoking cigarettes. He has a 30.00 pack-year smoking history. He has never used smokeless tobacco. He reports that he does not drink alcohol and does not use drugs.   Family History: History reviewed. No pertinent family history.  Physical Exam: Vitals:   06/21/21 2157 06/21/21 2226 06/22/21 0226 06/22/21 0649  BP: (!) 190/89  (!) 165/86 (!) 142/105  Pulse: 100 100 98 (!) 108  Resp: '18 19 16 18  '$ Temp: 99.3 F (37.4 C)   99 F (37.2 C)  TempSrc: Oral   Oral  SpO2: 98% 98% 100% 99%  Weight:      Height:        Constitutional - resting comfortably, no  acute distress Eyes - vision grossly intact. Sclera anicteric. PERRL.  Nose - no gross deformity or drainage Mouth - no oral lesions noted Throat - no swelling or erythema Endo - no obvious thyromegaly CV -RRR.  (+)S1S2, no murmurs; no JVD or peripheral edema Resp - No increased work of breathing, good aeration bilaterally, no wheeze or crackles GI - (+)BS, soft, non-tender, non-distended MSK -brace over right leg.  TED hose on left leg.  No edema. Skin - no obvious rashes or lesions Neuro - alert, aware, oriented to person/place/time. No gross deficit  Psych - calm, normal mood and affect    Data reviewed:  I have personally reviewed following labs and imaging studies Labs:  CBC: Recent Labs  Lab 06/20/21 1305 06/21/21 0329 06/22/21 0327  WBC 11.7* 15.3* 14.6*  HGB 11.1*  10.9* 10.8*  HCT 35.3* 34.0* 34.1*  MCV 92.4 90.4 90.5  PLT 279 277 [id number]    Basic Metabolic Panel: Recent Labs  Lab 06/20/21 1305 06/21/21 0329 06/22/21 0326  NA 138 145  --   K 4.5 5.4*  --   CL 106 113*  --   CO2 21* 22  --   GLUCOSE 299* 247*  --   BUN 61* 58*  --   CREATININE 3.39* 3.00* 2.68*  CALCIUM 9.2 9.8  --    GFR Estimated Creatinine Clearance: 34.1 mL/min (A) (by C-G formula based on SCr of 2.68 mg/dL (H)). Liver Function Tests: No results for input(s): AST, ALT, ALKPHOS, BILITOT, PROT, ALBUMIN in the last 168 hours. No results for input(s): LIPASE, AMYLASE in the last 168 hours. No results for input(s): AMMONIA in the last 168 hours. Coagulation profile No results for input(s): INR, PROTIME in the last 168 hours.  Cardiac Enzymes: No results for input(s): CKTOTAL, CKMB, CKMBINDEX, TROPONINI in the last 168 hours. BNP: Invalid input(s): POCBNP CBG: Recent Labs  Lab 06/21/21 0908 06/21/21 1208 06/21/21 1650 06/21/21 2158 06/22/21 0727  GLUCAP 257* 175* 118* 167* 139*   D-Dimer No results for input(s): DDIMER in the last 72 hours. Hgb A1c Recent Labs    06/20/21 1305 06/21/21 0329  HGBA1C 7.2* 7.3*   Lipid Profile No results for input(s): CHOL, HDL, LDLCALC, TRIG, CHOLHDL, LDLDIRECT in the last 72 hours. Thyroid function studies No results for input(s): TSH, T4TOTAL, T3FREE, THYROIDAB in the last 72 hours.  Invalid input(s): FREET3 Anemia work up No results for input(s): VITAMINB12, FOLATE, FERRITIN, TIBC, IRON, RETICCTPCT in the last 72 hours. Urinalysis No results found for: COLORURINE, APPEARANCEUR, [place], [place], GLUCOSEU, [place], [place], KETONESUR, PROTEINUR, UROBILINOGEN, NITRITE, [place]   Microbiology Recent Results (from the past 240 hour(s))  SARS Coronavirus 2 by RT PCR (hospital order, performed in [facility] hospital lab) Nasopharyngeal Nasopharyngeal Swab     Status: None   Collection Time: 06/20/21  1:01 PM    Specimen: Nasopharyngeal Swab  Result Value Ref Range Status   SARS Coronavirus 2 NEGATIVE NEGATIVE Final    Comment: (NOTE) SARS-CoV-2 target nucleic acids are NOT DETECTED.  The SARS-CoV-2 RNA is generally detectable in upper and lower respiratory specimens during the acute phase of infection. The lowest concentration of SARS-CoV-2 viral copies this assay can detect is 250 copies / mL. A negative result does not preclude SARS-CoV-2 infection and should not be used as the sole basis for treatment or other patient management decisions.  A negative result may occur with improper specimen collection / handling, submission of specimen other than nasopharyngeal swab, presence of viral mutation(s)  within the areas targeted by this assay, and inadequate number of viral copies (<250 copies / mL). A negative result must be combined with clinical observations, patient history, and epidemiological information.  Fact Sheet for Patients:   StrictlyIdeas.no  Fact Sheet for Healthcare Providers: BankingDealers.co.za  This test is not yet approved or  cleared by the [place] FDA and has been authorized for detection and/or diagnosis of SARS-CoV-2 by FDA under an Emergency Use Authorization (EUA).  This EUA will remain in effect (meaning this test can be used) for the duration of the COVID-19 declaration under Section 564(b)(1) of the Act, 21 U.S.C. section 360bbb-3(b)(1), unless the authorization is terminated or revoked sooner.  Performed at [facility], [place] [address], [place], [place] [address]      Inpatient Medications:   Scheduled Meds:  [START ON 06/23/2021] allopurinol  100 mg Oral Daily   carvedilol  25 mg Oral BID WC   cloNIDine  0.1 mg Oral BID   docusate sodium  100 mg Oral BID   enoxaparin (LOVENOX) injection  30 mg Subcutaneous Q12H   ferrous sulfate  325 mg Oral Q breakfast   furosemide  40 mg Oral  Daily   hydrALAZINE  100 mg Oral TID   insulin aspart  0-20 Units Subcutaneous TID WC   insulin aspart  0-5 Units Subcutaneous QHS   insulin aspart  6 Units Subcutaneous TID WC   insulin glargine-yfgn  40 Units Subcutaneous BID   [START ON 06/23/2021] lisinopril  40 mg Oral Daily   mouth rinse  15 mL Mouth Rinse BID   multivitamin with minerals  1 tablet Oral Daily   rosuvastatin  10 mg Oral Daily   Continuous Infusions:  sodium chloride Stopped (06/21/21 1030)   methocarbamol (ROBAXIN) IV Stopped (06/20/21 1848)     Radiological Exams on Admission: DG Knee 1-2 Views Right  Result Date: 06/20/2021 CLINICAL DATA:  Patella fracture ORIF. EXAM: RIGHT KNEE - 1-2 VIEW COMPARISON:  Right knee x-rays from same day. FLUOROSCOPY TIME:  0.3 minutes. C-arm fluoroscopic images were obtained intraoperatively and submitted for post operative interpretation. FINDINGS: AP and lateral intraoperative fluoroscopic images of the right knee demonstrate two screws fixating the patellar fracture, and which is in near anatomic alignment. IMPRESSION: Intraoperative fluoroscopic guidance for right patella ORIF. Electronically Signed   By: Titus Dubin M.D.   On: 06/20/2021 18:42   DG Knee 1-2 Views Right  Result Date: 06/20/2021 CLINICAL DATA:  Intraoperative evaluation, incorrect needle count EXAM: RIGHT KNEE - 1-2 VIEW COMPARISON:  None. FINDINGS: Frontal view of the right knee was performed. Examination covers the area from the mid right femoral diaphysis through the proximal right tibia and fibula. There are 2 screws within the patella. Patellar fracture. Well aligned. Skin staples are identified. There are no other radiopaque foreign bodies. Minimal vascular calcifications. IMPRESSION: 1. Postsurgical changes related to patellar fracture repair. 2. No unexpected radiopaque foreign body. Specifically, no evidence of surgical needle. These results were called by telephone at the time of interpretation on 06/20/2021  at 5:53 pm to the operating room and results given to [place], who verbally acknowledged these results. Electronically Signed   By: Randa Ngo M.D.   On: 06/20/2021 17:54   DG C-Arm 1-60 Min-No Report  Result Date: 06/20/2021 Fluoroscopy was utilized by the requesting physician.  No radiographic interpretation.    Impression/Recommendations Accidental fall Right patellar fracture s/p ORIF on 9/9 -Defer to orthopedic surgery.  Resistant hypertension-Home meds include  clonidine, Coreg, hydralazine, lisinopril, Lasix and spironolactone.  BP was elevated but improved after resuming BP meds.  BP could be due to pain as well. -I will hold his lisinopril with hope of getting some renal function back -Continue carvedilol, hydralazine and Lasix.  -Increase clonidine to 0.2 mg twice daily -Add as needed hydralazine with parameters.  Chronic diastolic CHF-TTE in [id number] with LVEF of 60 to 65%, G2 DD.  Appears euvolemic on exam. -Continue home Lasix -Treat hypertension as above -Monitor fluid status -Change diet to carb modified on heart healthy diet.  AKI on CKD-4 with azotemia-improving.  Unclear b/l  but Cr 2.39 in 10/2019.  Followed by nephrology at [facility]    06/20/21 1305 06/21/21 0329 06/22/21 0326 06/22/21 0327  BUN 61* 58*  --  45*  CREATININE 3.39* 3.00* 2.68* 2.71*  -Holding lisinopril to see if we can get some renal function back -Discontinue metformin and reduce allopurinol   IDDM-2 with hyperglycemia and CKD-4: A1c 7.3%.  On metformin and insulin at home Recent Labs  Lab 06/21/21 0908 06/21/21 1208 06/21/21 1650 06/21/21 2158 06/22/21 0727  GLUCAP 257* 175* 118* 167* 139*  -Discontinue metformin given his low GFR -Continue current insulin regimen  OSA on CPAP -Continue nightly CPAP  History of gout -Reduce his allopurinol given his GFR   Tobacco use disorder: Reports smoking "less than a pack a day". -Encourage cessation. -Not interested in nicotine  patch.  Morbid obesity: BMI 38.84. -Encourage lifestyle change to lose weight   Thank you for this consultation.  Our [facility] hospitalist team will follow the patient with you.   Time Spent: 50 minutes with more than 50% spent in reviewing records, counseling patient/family and coordinating care.  Mercy Riding M.D. [facility] 06/22/2021, 10:02 AM

## 2021-06-22 NOTE — Progress Notes (Signed)
Occupational Therapy Treatment Patient Details Name: Derek Ibarra MRN: [id number] DOB: 1956-03-09 Today's Date: 06/22/2021    History of present illness Patient is s/p ORIF of right patellar fracture. PMH significant for DM, gout, HTN, Lt humeral frature repair (2021).   OT comments  Treatment focused on sit to stand and managing clothing while in standing. Needed assistance to don shorts over feet due to bulkiness of knee immobilizer. Patient able to manage short from knees - min guard to stand with RW - and pull up over hips. From standing patient able to ambulate approx 10 feet in room. However, continues to have difficulty with TDWB status - and he at least putting partial weight through it despite constant instruction and education - as he is weight shifting on it. Will need to master TDWB status before attempts to ambulate to bathroom to practice transfers.    Follow Up Recommendations  Home health OT    Equipment Recommendations  Other (comment);Hospital bed [facility] needs to be wide. Patient requesting hospital bed. Due his height and knee immobilizer - transfers from typical living room furniture will be difficult.)    Recommendations for Other Services      Precautions / Restrictions Precautions Precautions: Fall Required Braces or Orthoses: Knee Immobilizer - Right Knee Immobilizer - Right: On at all times Restrictions Weight Bearing Restrictions: Yes RLE Weight Bearing: Touchdown weight bearing       Mobility Bed Mobility Overal bed mobility: Needs Assistance Bed Mobility: Supine to Sit     Supine to sit: Supervision     General bed mobility comments: up in chair    Transfers Overall transfer level: Needs assistance Equipment used: Rolling walker (2 wheeled) Transfers: Sit to/from Stand Sit to Stand: Min guard         General transfer comment: Min guard to stand from recliner - demonstrates improved ability to manuever RLE when transferring in to standing.  Ambulated approx 5 feet forward, turned around and returned to recliner. Throughout movement therapist educating and cueing patinet in regards to TDWB status.Therapist placed part of a styrofoam cup underneath his foot to cue him to not press down.  Patient states "i've got it." However he continues to weight shift on to RLE without an obvious hop - he is at least partially weight bearing.    Balance Overall balance assessment: Needs assistance Sitting-balance support: No upper extremity supported Sitting balance-Leahy Scale: Normal     Standing balance support: During functional activity Standing balance-Leahy Scale: Fair Standing balance comment: use of RW for standing balance with adherence to TDWB precautions.                           ADL either performed or assessed with clinical judgement   ADL Overall ADL's : Needs assistance/impaired                     Lower Body Dressing: Maximal assistance;Min guard;Sit to/from stand Lower Body Dressing Details (indicate cue type and reason): Patient able to manage clothing from knees up - pulling shorts up after he transferred in to standing.                     Vision Patient Visual Report: No change from baseline     Perception     Praxis      Cognition Arousal/Alertness: Awake/alert Behavior During Therapy: WFL for tasks assessed/performed Overall Cognitive Status: Within Functional Limits for tasks  assessed                                          Exercises     Shoulder Instructions       General Comments      Pertinent Vitals/ Pain       Pain Assessment: Faces Pain Score: 5  Faces Pain Scale: Hurts little more Pain Location: Rt knee Pain Descriptors / Indicators: Grimacing;Sore Pain Intervention(s): Limited activity within patient's tolerance;Repositioned (removed ice bags at his request)  Home Living                                          Prior  Functioning/Environment              Frequency  Min 2X/week        Progress Toward Goals  OT Goals(current goals can now be found in the care plan section)  Progress towards OT goals: Progressing toward goals  Acute Rehab OT Goals Patient Stated Goal: to improve functional tasks in order to go home OT Goal Formulation: With patient Time For Goal Achievement: 07/05/21 Potential to Achieve Goals: Good  Plan Discharge plan remains appropriate    Co-evaluation          OT goals addressed during session: ADL's and self-care      AM-PAC OT "6 Clicks" Daily Activity     Outcome Measure   Help from another person eating meals?: None Help from another person taking care of personal grooming?: A Little Help from another person toileting, which includes using toliet, bedpan, or urinal?: A Lot Help from another person bathing (including washing, rinsing, drying)?: A Lot Help from another person to put on and taking off regular upper body clothing?: A Little Help from another person to put on and taking off regular lower body clothing?: A Lot 6 Click Score: 16    End of Session Equipment Utilized During Treatment: Rolling walker  OT Visit Diagnosis: Other abnormalities of gait and mobility (R26.89);Unsteadiness on feet (R26.81);Pain   Activity Tolerance Patient tolerated treatment well   Patient Left in chair;with call bell/phone within reach;with chair alarm set   Nurse Communication Mobility status        Time: [id number] OT Time Calculation (min): 11 min  Charges: OT General Charges $OT Visit: 1 Visit OT Treatments $Self Care/Home Management : 8-22 mins  Derl Barrow, OTR/L [place]  Office [phone] Pager: [place] 06/22/2021, 12:28 PM

## 2021-06-22 NOTE — Progress Notes (Signed)
   Subjective: 2 Days Post-Op Procedure(s) (LRB): OPEN REDUCTION INTERNAL (ORIF) FIXATION PATELLA (Right) Patient reports pain as mild.   Patient seen in rounds for Dr. Veverly Fells. Patient is well, and has had no acute complaints or problems. He had issues with significant HTN yesterday. Voiding without difficulty. Ambulated 4 feet with PT. We will continue therapy today.   Objective: Vital signs in last 24 hours: Temp:  [98.6 F (37 C)-99.3 F (37.4 C)] 99 F (37.2 C) (09/11 0649) Pulse Rate:  [96-108] 108 (09/11 0649) Resp:  [16-19] 18 (09/11 0649) BP: (142-193)/(85-105) 142/105 (09/11 0649) SpO2:  [97 %-100 %] 99 % (09/11 0649)  Intake/Output from previous day:  Intake/Output Summary (Last 24 hours) at 06/22/2021 0932 Last data filed at 06/22/2021 0807 Gross per 24 hour  Intake 1125.83 ml  Output 3150 ml  Net -2024.17 ml     Intake/Output this shift: Total I/O In: 120 [P.O.:120] Out: 320 [Urine:320]  Labs: Recent Labs    06/20/21 1305 06/21/21 0329 06/22/21 0327  HGB 11.1* 10.9* 10.8*   Recent Labs    06/21/21 0329 06/22/21 0327  WBC 15.3* 14.6*  RBC 3.76* 3.77*  HCT 34.0* 34.1*  PLT 277 286   Recent Labs    06/20/21 1305 06/21/21 0329 06/22/21 0326  NA 138 145  --   K 4.5 5.4*  --   CL 106 113*  --   CO2 21* 22  --   BUN 61* 58*  --   CREATININE 3.39* 3.00* 2.68*  GLUCOSE 299* 247*  --   CALCIUM 9.2 9.8  --    No results for input(s): LABPT, INR in the last 72 hours.  Exam: General - Patient is Alert and Oriented Extremity - Neurologically intact Sensation intact distally Intact pulses distally Dorsiflexion/Plantar flexion intact Dressing - dressing C/D/I Motor Function - intact, moving foot and toes well on exam.   Past Medical History:  Diagnosis Date   Arthritis    Diabetes mellitus without complication (HCC)    Dyspnea    G6PD deficiency    Gout    Hypertension    Hypertensive heart disease    Morbid obesity (H[facility])    OSA on CPAP     16 CM pressure   Sleep apnea     Assessment/Plan: 2 Days Post-Op Procedure(s) (LRB): OPEN REDUCTION INTERNAL (ORIF) FIXATION PATELLA (Right) Active Problems:   Right patella fracture  Estimated body mass index is 38.84 kg/m as calculated from the following:   Height as of this encounter: '5\' 9"'$  (1.753 m).   Weight as of this encounter: 119.3 kg. Advance diet Up with therapy   - PWB to RLE, essentially TDWB for balance - lovenox and scds for dvt ppx - dc when cleared by PT.  Pt does have stairs at home and lives upstairs.  He has not progressed enough with PT for safe discharge home. I discussed with Dr. Stann Mainland, who agrees that we will consult medicine at this point for assistance with medical comorbidities (DM, HTN, CKD) while he is inpatient. I will consult them today, and appreciate their assistance in caring for this patient.   Griffith Citron, PA-C Orthopedic Surgery [phone] 06/22/2021, 9:32 AM

## 2021-06-22 NOTE — Progress Notes (Signed)
Physical Therapy Treatment Patient Details Name: Derek Ibarra MRN: [id number] DOB: 1956-03-05 Today's Date: 06/22/2021    History of Present Illness Patient is s/p ORIF of right patellar fracture. PMH significant for DM, gout, HTN, Lt humeral frature repair (2021).    PT Comments    Pt is slowly progressing toward acute PT goals this session with slightly improved adherence to TDWB precautions and increased ambulation distance. Pt ambulated ~128f, initially with good adherence to TDWB precautions for ~4steps demonstrating "hopping" steps on Lt with use of RW with MIN A provided for safety/stability. Pt then with entire Rt foot flat on ground and with difficulty adhering to TDWB precautions despite cuing from therapist. Pt was limited with further mobility again this session due to reported onset of nasuea and needing to return to sitting, observed to be diaphoretic and was assisted to recliner. BP 153/827mg, HR 89bpm while seated in recliner following ambulation. Pt will benefit from continued skilled PT to increase their independence and maximize safety with mobility.     Follow Up Recommendations  Home health PT;Supervision for mobility/OOB     Equipment Recommendations  Rolling walker with 5" wheels    Recommendations for Other Services       Precautions / Restrictions Precautions Precautions: Fall Required Braces or Orthoses: Knee Immobilizer - Right Knee Immobilizer - Right: On at all times Restrictions Weight Bearing Restrictions: Yes RLE Weight Bearing: Touchdown weight bearing    Mobility  Bed Mobility Overal bed mobility: Needs Assistance Bed Mobility: Supine to Sit     Supine to sit: Supervision     General bed mobility comments: HOB elevated, supervision for safety with increased time to complete    Transfers Overall transfer level: Needs assistance Equipment used: Rolling walker (2 wheeled) Transfers: Sit to/from Stand Sit to Stand: Min assist;From elevated  surface         General transfer comment: from EOB; Review/demonstration of TDWB restrictions with mobility prior to transfers and ambulation. MIN A for power up to stand with cues for safe hand placement with use of RW. Increased time to rise. Pt with good adherence in standing to TDWB, intermittently NWB on Rt. MIN A to bring Rt LE forward with stand to sit transfer to avoid knee flexion when sitting.  Ambulation/Gait Ambulation/Gait assistance: Min assist Gait Distance (Feet): 8 Feet Assistive device: Rolling walker (2 wheeled) Gait Pattern/deviations: Step-to pattern;Decreased stride length;Decreased weight shift to right;Shuffle;Decreased step length - left;Decreased step length - right Gait velocity: decr   General Gait Details: Pt initially with good adherence to TDWB precautions for ~4steps demonstrating "hopping" steps on Lt with use of RW. Pt then with entire Rt foot flat on ground and with difficulty adhering to TDWB precautions of Rt LE despite cuing from therapist, close chair follow provided for safety. After ~28f16ft stated "that's as far as I can go" with reported onset of nasuea and observed to be diaphoretic and assisted to recliner. vitals: 153/62m24m 89bpm while seated in recliner following ambulation.   Stairs             Wheelchair Mobility    Modified Rankin (Stroke Patients Only)       Balance Overall balance assessment: Needs assistance Sitting-balance support: No upper extremity supported Sitting balance-Leahy Scale: Normal     Standing balance support: Bilateral upper extremity supported;During functional activity Standing balance-Leahy Scale: Poor Standing balance comment: use of RW for standing balance with adherence to TDWB precautions.  Cognition Arousal/Alertness: Awake/alert Behavior During Therapy: WFL for tasks assessed/performed Overall Cognitive Status: Within Functional Limits for tasks assessed                                         Exercises      General Comments        Pertinent Vitals/Pain Pain Assessment: 0-10 Pain Score: 5  Pain Location: Rt knee Pain Descriptors / Indicators: Grimacing;Sore Pain Intervention(s): Limited activity within patient's tolerance;Monitored during session;Repositioned    Home Living                      Prior Function            PT Goals (current goals can now be found in the care plan section) Acute Rehab PT Goals Patient Stated Goal: to improve functional tasks in order to go home PT Goal Formulation: With patient Time For Goal Achievement: 07/05/21 Potential to Achieve Goals: Good Progress towards PT goals: Progressing toward goals    Frequency    7X/week      PT Plan Current plan remains appropriate    Co-evaluation              AM-PAC PT "6 Clicks" Mobility   Outcome Measure  Help needed turning from your back to your side while in a flat bed without using bedrails?: A Little Help needed moving from lying on your back to sitting on the side of a flat bed without using bedrails?: A Little Help needed moving to and from a bed to a chair (including a wheelchair)?: A Little Help needed standing up from a chair using your arms (e.g., wheelchair or bedside chair)?: A Little Help needed to walk in hospital room?: A Little Help needed climbing 3-5 steps with a railing? : A Lot 6 Click Score: 17    End of Session Equipment Utilized During Treatment: Gait belt Activity Tolerance: Patient limited by fatigue;Treatment limited secondary to medical complications (Comment) (onset of nausea) Patient left: in chair;with call bell/phone within reach;with chair alarm set (with MD in room) Nurse Communication: Mobility status (pt symptoms and vitals during session) PT Visit Diagnosis: Pain;Muscle weakness (generalized) (M62.81);Difficulty in walking, not elsewhere classified (R26.2) Pain - Right/Left:  Right Pain - part of body: Knee     Time: [id number] PT Time Calculation (min) (ACUTE ONLY): 23 min  Charges:  $Gait Training: 8-22 mins $Therapeutic Activity: 8-22 mins                     Festus Barren PT, DPT  Acute Rehabilitation Services  Office [phone]  06/22/2021, 10:59 AM

## 2021-06-22 NOTE — Plan of Care (Signed)
  Problem: Education: Goal: Knowledge of General Education information will improve Description Including pain rating scale, medication(s)/side effects and non-pharmacologic comfort measures Outcome: Progressing   

## 2021-06-23 DIAGNOSIS — I10 Essential (primary) hypertension: Secondary | ICD-10-CM

## 2021-06-23 LAB — GLUCOSE, CAPILLARY
Glucose-Capillary: 140 mg/dL — ABNORMAL HIGH (ref 70–99)
Glucose-Capillary: 137 mg/dL — ABNORMAL HIGH (ref 70–99)
Glucose-Capillary: 74 mg/dL (ref 70–99)
Glucose-Capillary: 162 mg/dL — ABNORMAL HIGH (ref 70–99)

## 2021-06-23 LAB — IRON AND TIBC
Iron: 30 ug/dL — ABNORMAL LOW (ref 45–182)
Saturation Ratios: 14 % — ABNORMAL LOW (ref 17.9–39.5)
TIBC: 220 ug/dL — ABNORMAL LOW (ref 250–450)
UIBC: 190 ug/dL

## 2021-06-23 LAB — RENAL FUNCTION PANEL
Albumin: 3.1 g/dL — ABNORMAL LOW (ref 3.5–5.0)
Anion gap: 10 (ref 5–15)
BUN: 57 mg/dL — ABNORMAL HIGH (ref 8–23)
CO2: 23 mmol/L (ref 22–32)
Calcium: 9.3 mg/dL (ref 8.9–10.3)
Chloride: 104 mmol/L (ref 98–111)
Creatinine, Ser: 2.94 mg/dL — ABNORMAL HIGH (ref 0.61–1.24)
GFR, Estimated: 23 mL/min — ABNORMAL LOW (ref >60)
Glucose, Bld: 165 mg/dL — ABNORMAL HIGH (ref 70–99)
Phosphorus: 3.9 mg/dL (ref 2.5–4.6)
Potassium: 4.2 mmol/L (ref 3.5–5.1)
Sodium: 137 mmol/L (ref 135–145)

## 2021-06-23 LAB — MAGNESIUM: Magnesium: 2.3 mg/dL (ref 1.7–2.4)

## 2021-06-23 LAB — CBC
HCT: 32 % — ABNORMAL LOW (ref 39.0–52.0)
Hemoglobin: 10.4 g/dL — ABNORMAL LOW (ref 13.0–17.0)
MCH: 29.5 pg (ref 26.0–34.0)
MCHC: 32.5 g/dL (ref 30.0–36.0)
MCV: 90.9 fL (ref 80.0–100.0)
Platelets: 260 10*3/uL (ref 150–400)
RBC: 3.52 MIL/uL — ABNORMAL LOW (ref 4.22–5.81)
RDW: 14.9 % (ref 11.5–15.5)
WBC: 12.7 10*3/uL — ABNORMAL HIGH (ref 4.0–10.5)
nRBC: 0 % (ref 0.0–0.2)

## 2021-06-23 LAB — FERRITIN: Ferritin: 298 ng/mL (ref 24–336)

## 2021-06-23 LAB — FOLATE: Folate: 14.1 ng/mL (ref >5.9)

## 2021-06-23 LAB — VITAMIN B12: Vitamin B-12: 1135 pg/mL — ABNORMAL HIGH (ref 180–914)

## 2021-06-23 LAB — TSH: TSH: 0.824 u[IU]/mL (ref 0.350–4.500)

## 2021-06-23 LAB — RETICULOCYTES
Immature Retic Fract: 23.8 % — ABNORMAL HIGH (ref 2.3–15.9)
RBC.: 3.56 MIL/uL — ABNORMAL LOW (ref 4.22–5.81)
Retic Count, Absolute: 106.1 10*3/uL (ref 19.0–186.0)
Retic Ct Pct: 3 % (ref 0.4–3.1)

## 2021-06-23 MED ORDER — ALLOPURINOL 100 MG PO TABS: 100.0000 mg | ORAL_TABLET | Freq: Every day | ORAL | 0 refills | Status: AC

## 2021-06-23 MED ORDER — CLONIDINE HCL 0.2 MG PO TABS: 0.2000 mg | ORAL_TABLET | Freq: Two times a day (BID) | ORAL | 0 refills | Status: AC

## 2021-06-23 NOTE — Progress Notes (Signed)
PROGRESS NOTE    Derek Ibarra  [id number] DOB: 10/07/1956 DOA: 06/20/2021 PCP: [facility]     Brief Narrative:  Derek Ibarra is an 65 y.o. male with PMH of diastolic CHF, IDDM-2, CKD-4, OSA on CPAP, resistant hypertension, morbid obesity and gout admitted by orthopedic surgery for right patellar fracture. Patient underwent ORIF on 06/20/2021.  Hospitalist service consulted for medical management including uncontrolled hypertension.  New events last 24 hours / Subjective: Patient just waking up, using his CPAP.  States that he follows with nephrology at the [facility], most recently seen over the summer and his kidney function has been stable.  He is not sure of his baseline creatinine or GFR levels.  States that his blood pressure has been running high lately due to stress.  Assessment & Plan:   Active Problems:   Right patella fracture   Hypertension -Continue clonidine 0.'2mg'$  BID -Coreg '25mg'$  BID -Hydralazine '100mg'$  TID  -Lasix '40mg'$  QD  AKI versus CKD stage IV -Unclear baseline, most recent result we have is from January 2021 with creatinine [id number], could certainly have worsened and patient may be in his new baseline.  Patient's creatinine has ranged from 3 --> 2.68 --> 2.71 --> 2.94  -Followed by nephrology at the [facility] -Stop metformin, lisinopril  -Reduced dose of allopurinol '100mg'$  QD  -Follow-up with nephrology at the [facility] in the next few weeks after discharge  Chronic diastolic heart failure -Without acute exacerbation -Continue home Lasix  Diabetes mellitus type 2 -Well-controlled, A1c 7.3 -Stop metformin due to his creatinine -Continue lantus   OSA -Continue CPAP nightly  Ok to discharge when cleared by primary service. He is to follow up with PCP and Nephrology at [facility]. Med rec completed with changes above.   DVT prophylaxis:  enoxaparin (LOVENOX) injection 30 mg Start: 06/21/21 0800 SCDs Start: 06/20/21 2134 Place TED hose Start: 06/20/21 2134  Code Status:      Code Status Orders  (From admission, onward)           Start     Ordered   06/20/21 2134  Full code  Continuous        06/20/21 2133           Code Status History     Date Active Date Inactive Code Status Order ID Comments User Context   10/14/2019 2028 10/17/2019 1638 Full Code [id number]  Etta Quill, DO ED   10/27/2014 0428 10/29/2014 1328 Full Code [id number]  Berle Mull, MD Inpatient      Family Communication: None at bedside Disposition Plan: Home with home health, discharge per primary service    Antimicrobials:  Anti-infectives (From admission, onward)    Start     Dose/Rate Route Frequency Ordered Stop   06/20/21 2230  ceFAZolin (ANCEF) IVPB 2g/100 mL premix        2 g 200 mL/hr over 30 Minutes Intravenous Every 6 hours 06/20/21 2133 06/21/21 0421   06/20/21 1315  ceFAZolin (ANCEF) IVPB 3g/100 mL premix        3 g 200 mL/hr over 30 Minutes Intravenous On call to O.R. 06/20/21 1300 06/20/21 1624        Objective: Vitals:   06/22/21 0649 06/22/21 1345 06/22/21 2219 06/23/21 0528  BP: (!) 142/105 (!) 157/94 (!) 152/83 (!) 152/77  Pulse: (!) 108 (!) 55 (!) 109 89  Resp: '18 18 18 18  '$ Temp: 99 F (37.2 C) 98.3 F (36.8 C) 98.7 F (37.1 C) 97.6 F (36.4 C)  TempSrc: Oral Oral Oral Oral  SpO2: 99% 98% 95% 94%  Weight:      Height:        Intake/Output Summary (Last 24 hours) at 06/23/2021 1011 Last data filed at 06/23/2021 0851 Gross per 24 hour  Intake 540 ml  Output 700 ml  Net -160 ml   Filed Weights   06/19/21 1715 06/20/21 1305  Weight: 120.7 kg 119.3 kg    Examination:  General exam: Appears calm and comfortable  Respiratory system: Clear to auscultation. Respiratory effort normal. No respiratory distress. No conversational dyspnea.  Cardiovascular system: S1 & S2 heard, RRR. No murmurs. No pedal edema. Gastrointestinal system: Abdomen is nondistended, soft and nontender. Normal bowel sounds heard. Central nervous system: Alert and  oriented. No focal neurological deficits. Speech clear.  Extremities: Right lower extremity in stabilizer Psychiatry: Judgement and insight appear normal. Mood & affect appropriate.   Data Reviewed: I have personally reviewed following labs and imaging studies  CBC: Recent Labs  Lab 06/20/21 1305 06/21/21 0329 06/22/21 0327 06/23/21 0319  WBC 11.7* 15.3* 14.6* 12.7*  HGB 11.1* 10.9* 10.8* 10.4*  HCT 35.3* 34.0* 34.1* 32.0*  MCV 92.4 90.4 90.5 90.9  PLT 279 277 286 [id number]   Basic Metabolic Panel: Recent Labs  Lab 06/20/21 1305 06/21/21 0329 06/22/21 0326 06/22/21 0327 06/23/21 0319  NA 138 145  --  144 137  K 4.5 5.4*  --  4.3 4.2  CL 106 113*  --  110 104  CO2 21* 22  --  23 23  GLUCOSE 299* 247*  --  131* 165*  BUN 61* 58*  --  45* 57*  CREATININE 3.39* 3.00* 2.68* 2.71* 2.94*  CALCIUM 9.2 9.8  --  9.8 9.3  MG  --   --   --   --  2.3  PHOS  --   --   --   --  3.9   GFR: Estimated Creatinine Clearance: 31.1 mL/min (A) (by C-G formula based on SCr of 2.94 mg/dL (H)). Liver Function Tests: Recent Labs  Lab 06/23/21 0319  ALBUMIN 3.1*   No results for input(s): LIPASE, AMYLASE in the last 168 hours. No results for input(s): AMMONIA in the last 168 hours. Coagulation Profile: No results for input(s): INR, PROTIME in the last 168 hours. Cardiac Enzymes: No results for input(s): CKTOTAL, CKMB, CKMBINDEX, TROPONINI in the last 168 hours. BNP (last 3 results) No results for input(s): PROBNP in the last 8760 hours. HbA1C: Recent Labs    06/20/21 1305 06/21/21 0329  HGBA1C 7.2* 7.3*   CBG: Recent Labs  Lab 06/22/21 0727 06/22/21 1129 06/22/21 1611 06/22/21 2220 06/23/21 0711  GLUCAP 139* 190* 137* 167* 140*   Lipid Profile: No results for input(s): CHOL, HDL, LDLCALC, TRIG, CHOLHDL, LDLDIRECT in the last 72 hours. Thyroid Function Tests: Recent Labs    06/23/21 0319  TSH 0.824   Anemia Panel: Recent Labs    06/23/21 0319  VITAMINB12 1,135*   FOLATE 14.1  FERRITIN 298  TIBC 220*  IRON 30*  RETICCTPCT 3.0   Sepsis Labs: No results for input(s): PROCALCITON, LATICACIDVEN in the last 168 hours.  Recent Results (from the past 240 hour(s))  SARS Coronavirus 2 by RT PCR (hospital order, performed in [facility] hospital lab) Nasopharyngeal Nasopharyngeal Swab     Status: None   Collection Time: 06/20/21  1:01 PM   Specimen: Nasopharyngeal Swab  Result Value Ref Range Status   SARS Coronavirus 2 NEGATIVE NEGATIVE Final  Comment: (NOTE) SARS-CoV-2 target nucleic acids are NOT DETECTED.  The SARS-CoV-2 RNA is generally detectable in upper and lower respiratory specimens during the acute phase of infection. The lowest concentration of SARS-CoV-2 viral copies this assay can detect is 250 copies / mL. A negative result does not preclude SARS-CoV-2 infection and should not be used as the sole basis for treatment or other patient management decisions.  A negative result may occur with improper specimen collection / handling, submission of specimen other than nasopharyngeal swab, presence of viral mutation(s) within the areas targeted by this assay, and inadequate number of viral copies (<250 copies / mL). A negative result must be combined with clinical observations, patient history, and epidemiological information.  Fact Sheet for Patients:   StrictlyIdeas.no  Fact Sheet for Healthcare Providers: BankingDealers.co.za  This test is not yet approved or  cleared by the [place] FDA and has been authorized for detection and/or diagnosis of SARS-CoV-2 by FDA under an Emergency Use Authorization (EUA).  This EUA will remain in effect (meaning this test can be used) for the duration of the COVID-19 declaration under Section 564(b)(1) of the Act, 21 U.S.C. section 360bbb-3(b)(1), unless the authorization is terminated or revoked sooner.  Performed at [facility], [place] [address], [place], [place] [address]       Radiology Studies: No results found.    Scheduled Meds:  allopurinol  100 mg Oral Daily   carvedilol  25 mg Oral BID WC   cloNIDine  0.2 mg Oral BID   docusate sodium  100 mg Oral BID   enoxaparin (LOVENOX) injection  30 mg Subcutaneous Q12H   ferrous sulfate  325 mg Oral Q breakfast   furosemide  40 mg Oral Daily   hydrALAZINE  100 mg Oral TID   insulin aspart  0-20 Units Subcutaneous TID WC   insulin aspart  0-5 Units Subcutaneous QHS   insulin aspart  6 Units Subcutaneous TID WC   insulin glargine-yfgn  40 Units Subcutaneous BID   mouth rinse  15 mL Mouth Rinse BID   multivitamin with minerals  1 tablet Oral Daily   rosuvastatin  10 mg Oral Daily   Continuous Infusions:  sodium chloride Stopped (06/21/21 1030)   methocarbamol (ROBAXIN) IV Stopped (06/20/21 1848)     LOS: 2 days      Time spent: 25 minutes   Dessa Phi, DO [facility] 06/23/2021, 10:11 AM   Available via Epic secure chat 7am-7pm After these hours, please refer to coverage provider listed on [email or web address]

## 2021-06-23 NOTE — TOC Initial Note (Signed)
Transition of Care [facility]) - Initial/Assessment Note   Patient Details  Name: Derek Ibarra MRN: [id number] Date of Birth: July 27, 1956  Transition of Care [facility]) CM/SW Contact:    Sherie Don, LCSW Phone Number: 06/23/2021, 12:16 PM  Clinical Narrative: CSW spoke with patient regarding needing a hospital bed for 6 weeks and HHPT at discharge. CSW explained to patient that CSW will have to go through the patient's [facility] in [place] to get [facility] for [facility] and DME. Patient is aware this will take time. Patient reported CSW left a message for the RN for patient's [facility] PCP, Dr. Margaretmary Bayley, requesting call back to set up services. TOC awaiting [facility] follow up.  Expected Discharge Plan: [place] Barriers to Discharge: Continued Medical Work up, Ship broker  Patient Goals and CMS Choice Patient states their goals for this hospitalization and ongoing recovery are:: Return home with [facility] and hospital bed CMS Medicare.gov Compare Post Acute Care list provided to:: Patient Choice offered to / list presented to : Patient  Expected Discharge Plan and Services Expected Discharge Plan: [employer] In-house Referral: Clinical Social Work Post Acute Care Choice: Home Health, Durable Medical Equipment Living arrangements for the past 2 months: [place]  Prior Living Arrangements/Services Living arrangements for the past 2 months: Single Family Home Patient language and need for interpreter reviewed:: Yes Do you feel safe going back to the place where you live?: Yes      Need for Family Participation in Patient Care: Yes (Comment) Care giver support system in place?: Yes (comment) Criminal Activity/Legal Involvement Pertinent to Current Situation/Hospitalization: No - Comment as needed  Activities of Daily Living Home Assistive Devices/Equipment: None ADL Screening (condition at time of admission) Patient's cognitive ability adequate to safely complete daily  activities?: Yes Is the patient deaf or have difficulty hearing?: No Does the patient have difficulty seeing, even when wearing glasses/contacts?: No Does the patient have difficulty concentrating, remembering, or making decisions?: No Patient able to express need for assistance with ADLs?: Yes Does the patient have difficulty dressing or bathing?: Yes Independently performs ADLs?: Yes (appropriate for developmental age) Does the patient have difficulty walking or climbing stairs?: Yes Weakness of Legs: Right Weakness of Arms/Hands: None  Permission Sought/Granted Permission sought to share information with : Other (comment) Permission granted to share information with : Yes, Verbal Permission Granted Permission granted to share info w AGENCY: Kathalene Frames, [facility] agencies  Emotional Assessment Attitude/Demeanor/Rapport: Engaged Affect (typically observed): Accepting Orientation: : Oriented to Self, Oriented to Place, Oriented to  Time, Oriented to Situation Alcohol / Substance Use: Tobacco Use Psych Involvement: No (comment)  Admission diagnosis:  Right patella fracture [S82.001A] Patient Active Problem List   Diagnosis Date Noted   Right patella fracture 06/20/2021   CHF exacerbation ([place]) 10/15/2019   CKD (chronic kidney disease) stage 3, GFR 30-59 ml/min (HCC) 10/14/2019   Chronic diastolic heart failure ([place]) 10/29/2014   Pulmonary edema 10/27/2014   G6PD deficiency 10/27/2014   Acute respiratory failure with hypoxemia ([place]) 10/27/2014   Hypertensive heart disease 10/27/2014   Smoker 10/27/2014   Elevated troponin 10/27/2014   Morbid obesity ([place]) 10/27/2014   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

## 2021-06-23 NOTE — Progress Notes (Signed)
Occupational Therapy Treatment Patient Details Name: Derek Ibarra MRN: [id number] DOB: 1956/08/29 Today's Date: 06/23/2021   History of present illness Patient is s/p ORIF of right patellar fracture. PMH significant for DM, gout, HTN, Lt humeral frature repair (2021).   OT comments  Patient participated in sitting balance challenges on edge of bed with patient able to demonstrate weight shifting of hips to move hospital gown.patient was able to participate in static standing with good ability to maintain TDWB status.  Patient was noted to have continued challenges maintaining TDWB with standing tasks when using one UE to participate in ADL tasks (using urinal and management of pants). Patient was provided with continued multimodal education to maintain WB restrictions at all times during ADL tasks.Patient's discharge plan remains appropriate at this time. OT will continue to follow acutely.     Recommendations for follow up therapy are one component of a multi-disciplinary discharge planning process, led by the attending physician.  Recommendations may be updated based on patient status, additional functional criteria and insurance authorization.    Follow Up Recommendations  Home health OT    Equipment Recommendations  Other (comment);Hospital bed (bariatric drop arm BSC)    Recommendations for Other Services      Precautions / Restrictions Precautions Precautions: Fall Required Braces or Orthoses: Knee Immobilizer - Right Knee Immobilizer - Right: On at all times Restrictions Weight Bearing Restrictions: Yes RLE Weight Bearing: Touchdown weight bearing       Mobility Bed Mobility Overal bed mobility: Needs Assistance Bed Mobility: Supine to Sit     Supine to sit: Supervision;HOB elevated     General bed mobility comments: patient reported he was getting a hospital bed at home    Transfers Overall transfer level: Needs assistance Equipment used: Rolling walker (2  wheeled) Transfers: Sit to/from Stand Sit to Stand: Min guard         General transfer comment: patient was noted to be able to maintain WB restrictions from sit to stand but when standing balance challanges were introduced in standing with one UE support patient was noted to have increasingly difficult time with maintaining WB restrictions.    Balance Overall balance assessment: Needs assistance Sitting-balance support: No upper extremity supported Sitting balance-Leahy Scale: Normal     Standing balance support: During functional activity   Standing balance comment: able to maintain with BUE support. one UE support patient is noted to have difficutly with maintaining TDWB restrictions.             ADL either performed or assessed with clinical judgement   ADL Overall ADL's : Needs assistance/impaired Eating/Feeding: Independent   Grooming: Set up;Sitting   Upper Body Bathing: Set up;Sitting Upper Body Bathing Details (indicate cue type and reason): sitting on edge of bed Lower Body Bathing: Sit to/from stand;Moderate assistance Lower Body Bathing Details (indicate cue type and reason): sitting on edge of bed. Upper Body Dressing : Set up;Sitting     Lower Body Dressing Details (indicate cue type and reason): patient is TD to adjust KI in bed with patient reported soon to be wife and son will help him at home. patient indicated having a reacher at home already as well. patient was noted to trial moving shorts in standing while attempting to manage WB status with patient noted to have difficult time. patient was educated extensively on maintaining WB status in standing with patient having increased time to attempt cues.     Toileting- Clothing Manipulation and Hygiene: Minimal assistance Toileting -  Clothing Manipulation Details (indicate cue type and reason): to attempt to use urinal and maintain WB status. patient was max A to move hospital gown with min A to maintain  upright posture. patient educated on how to keep weight off RLE in standing. patient required increased time and continued education to attempt to follow.patient was noted to place RLE flat on floor during any balance challanges.       General ADL Comments: patient was able to complete transfer from edge of bed with RW to recliner in room with min A and max multimodal education to attempt to keep weight off RLE and extensive education for patient to hop and WB through BUE to maintain WB restrictions.     Vision Patient Visual Report: No change from baseline            Cognition Arousal/Alertness: Awake/alert Behavior During Therapy: WFL for tasks assessed/performed Overall Cognitive Status: Within Functional Limits for tasks assessed                             Pertinent Vitals/ Pain       Pain Assessment: No/denies pain (patient reported having just had pain medication)  Home Living                                          Prior Functioning/Environment              Frequency  Min 2X/week        Progress Toward Goals  OT Goals(current goals can now be found in the care plan section)  Progress towards OT goals: Progressing toward goals  Acute Rehab OT Goals Patient Stated Goal: to improve functional tasks in order to go home  Plan Discharge plan remains appropriate    Co-evaluation                 AM-PAC OT "6 Clicks" Daily Activity     Outcome Measure   Help from another person eating meals?: None Help from another person taking care of personal grooming?: A Little Help from another person toileting, which includes using toliet, bedpan, or urinal?: A Lot Help from another person bathing (including washing, rinsing, drying)?: A Lot Help from another person to put on and taking off regular upper body clothing?: A Little Help from another person to put on and taking off regular lower body clothing?: A Lot 6 Click Score:  16    End of Session Equipment Utilized During Treatment: Gait belt;Rolling walker  OT Visit Diagnosis: Other abnormalities of gait and mobility (R26.89);Unsteadiness on feet (R26.81);Pain Pain - Right/Left: Right Pain - part of body: Knee   Activity Tolerance Patient tolerated treatment well   Patient Left in chair;with call bell/phone within reach;with chair alarm set   Nurse Communication          Time: [id number] OT Time Calculation (min): 21 min  Charges: OT General Charges $OT Visit: 1 Visit OT Treatments $Self Care/Home Management : 8-22 mins  Jackelyn Poling OTR/L, [facility] Acute Rehabilitation Department Office# [phone] Pager# [phone]   [place] 06/23/2021, 12:35 PM

## 2021-06-23 NOTE — Progress Notes (Signed)
Orthopedics Progress Note  Subjective: Patient is comfortable this morning.   Objective:  Vitals:   06/22/21 2219 06/23/21 0528  BP: (!) 152/83 (!) 152/77  Pulse: (!) 109 89  Resp: 18 18  Temp: 98.7 F (37.1 C) 97.6 F (36.4 C)  SpO2: 95% 94%    General: Awake and alert  Musculoskeletal: Right knee dressing CDI. Compartments supple. Neg Homans Neurovascularly intact  Lab Results  Component Value Date   WBC 12.7 (H) 06/23/2021   HGB 10.4 (L) 06/23/2021   HCT 32.0 (L) 06/23/2021   MCV 90.9 06/23/2021   PLT 260 06/23/2021       Component Value Date/Time   NA 137 06/23/2021 0319   K 4.2 06/23/2021 0319   CL 104 06/23/2021 0319   CO2 23 06/23/2021 0319   GLUCOSE 165 (H) 06/23/2021 0319   BUN 57 (H) 06/23/2021 0319   CREATININE 2.94 (H) 06/23/2021 0319   CALCIUM 9.3 06/23/2021 0319   GFRNONAA 23 (L) 06/23/2021 0319   GFRAA 32 (L) 10/17/2019 0430    Lab Results  Component Value Date   INR 1.05 10/27/2014    Assessment/Plan: POD #3 s/p Procedure(s): OPEN REDUCTION INTERNAL (ORIF) FIXATION PATELLA Will need home health PT and a hospital bed for the first floor at home.  Continue PT, OT and discharge planning Thank you for medical management from hospitalist service Possible D/C today  Doran Heater. Veverly Fells, MD 06/23/2021 8:07 AM

## 2021-06-23 NOTE — Progress Notes (Signed)
PT Cancellation Note  Patient Details Name: Derek Ibarra MRN: [id number] DOB: 05/04/1956   Cancelled Treatment:    Reason Eval/Treat Not Completed: Other (comment);Patient declined, no reason specified (pt declined around 11am asking me to return later. Second attempt he was back in bed and reports nausea and declined therapy again.)   Lelon Mast 06/23/2021, 1:25 PM

## 2021-06-24 LAB — CBC
HCT: 32 % — ABNORMAL LOW (ref 39.0–52.0)
Hemoglobin: 10.3 g/dL — ABNORMAL LOW (ref 13.0–17.0)
MCH: 29.3 pg (ref 26.0–34.0)
MCHC: 32.2 g/dL (ref 30.0–36.0)
MCV: 90.9 fL (ref 80.0–100.0)
Platelets: 255 10*3/uL (ref 150–400)
RBC: 3.52 MIL/uL — ABNORMAL LOW (ref 4.22–5.81)
RDW: 14.8 % (ref 11.5–15.5)
WBC: 10.8 10*3/uL — ABNORMAL HIGH (ref 4.0–10.5)
nRBC: 0 % (ref 0.0–0.2)

## 2021-06-24 LAB — BASIC METABOLIC PANEL
Anion gap: 7 (ref 5–15)
BUN: 67 mg/dL — ABNORMAL HIGH (ref 8–23)
CO2: 23 mmol/L (ref 22–32)
Calcium: 9.3 mg/dL (ref 8.9–10.3)
Chloride: 112 mmol/L — ABNORMAL HIGH (ref 98–111)
Creatinine, Ser: 2.91 mg/dL — ABNORMAL HIGH (ref 0.61–1.24)
GFR, Estimated: 23 mL/min — ABNORMAL LOW (ref >60)
Glucose, Bld: 157 mg/dL — ABNORMAL HIGH (ref 70–99)
Potassium: 4.2 mmol/L (ref 3.5–5.1)
Sodium: 142 mmol/L (ref 135–145)

## 2021-06-24 LAB — GLUCOSE, CAPILLARY
Glucose-Capillary: 155 mg/dL — ABNORMAL HIGH (ref 70–99)
Glucose-Capillary: 104 mg/dL — ABNORMAL HIGH (ref 70–99)
Glucose-Capillary: 90 mg/dL (ref 70–99)
Glucose-Capillary: 87 mg/dL (ref 70–99)

## 2021-06-24 NOTE — Progress Notes (Signed)
PROGRESS NOTE    Lyrik Dickow  [id number] DOB: [birth year] DOA: 06/20/2021 PCP: [facility] [place]     Brief Narrative:  Callon Herridge is an 65 y.o. male with PMH of diastolic CHF, IDDM-2, CKD-4, OSA on CPAP, resistant hypertension, morbid obesity and gout admitted by orthopedic surgery for right patellar fracture. Patient underwent ORIF on 06/20/2021.  Hospitalist service consulted for medical management including uncontrolled hypertension.  New events last 24 hours / Subjective: Doing well, no new complaints, awaiting home health and equipment set up at home for discharge by ortho. BP improved 138/61.   Assessment & Plan:   Active Problems:   Right patella fracture   Hypertension -Continue clonidine 0.'2mg'$  BID -Coreg '25mg'$  BID -Hydralazine '100mg'$  TID  -Lasix '40mg'$  QD  AKI versus CKD stage IV -Unclear baseline, most recent result we have is from January 2021 with creatinine [id number], could certainly have worsened and patient may be in his new baseline.  Patient's creatinine has ranged from 3 --> 2.68 --> 2.71 --> 2.94 --> 2.91  -Followed by nephrology at the [facility] -Stop metformin (states he is not taking this at home), lisinopril  -Reduced dose of allopurinol '100mg'$  QD  -Follow-up with nephrology at the [facility] in the next few weeks after discharge  Chronic diastolic heart failure -Without acute exacerbation -Continue home Lasix  Diabetes mellitus type 2 -Well-controlled, A1c 7.3 -Stop metformin due to his creatinine -Continue lantus   OSA -Continue CPAP nightly  Ok to discharge when cleared by primary service. He is to follow up with PCP and Nephrology at [facility] Med rec completed with changes above. Will sign off today, please call if any new questions/concerns.   DVT prophylaxis:  enoxaparin (LOVENOX) injection 30 mg Start: 06/21/21 0800 SCDs Start: 06/20/21 2134 Place TED hose Start: 06/20/21 2134  Code Status:     Code Status Orders  (From admission, onward)            Start     Ordered   06/20/21 2134  Full code  Continuous        06/20/21 2133           Code Status History     Date Active Date Inactive Code Status Order ID Comments User Context   10/14/2019 2028 10/17/2019 1638 Full Code [id number]  Etta Quill, DO ED   10/27/2014 0428 10/29/2014 1328 Full Code [id number]  Berle Mull, MD Inpatient      Family Communication: None at bedside Disposition Plan: Home with home health, discharge per primary service    Antimicrobials:  Anti-infectives (From admission, onward)    Start     Dose/Rate Route Frequency Ordered Stop   06/20/21 2230  ceFAZolin (ANCEF) IVPB 2g/100 mL premix        2 g 200 mL/hr over 30 Minutes Intravenous Every 6 hours 06/20/21 2133 06/21/21 0421   06/20/21 1315  ceFAZolin (ANCEF) IVPB 3g/100 mL premix        3 g 200 mL/hr over 30 Minutes Intravenous On call to O.R. 06/20/21 1300 06/20/21 1624        Objective: Vitals:   06/23/21 1834 06/23/21 2120 06/24/21 0054 06/24/21 0555  BP: (!) 144/65 (!) 153/74 136/72 138/61  Pulse:  67 (!) 102 94  Resp: '20 17 17 17  '$ Temp:  (!) 97 F (36.1 C) 97.6 F (36.4 C) 98.3 F (36.8 C)  TempSrc:  Oral Oral Oral  SpO2:  97% 97% 99%  Weight:      Height:  Intake/Output Summary (Last 24 hours) at 06/24/2021 1109 Last data filed at 06/24/2021 1033 Gross per 24 hour  Intake 1680 ml  Output 875 ml  Net 805 ml    Filed Weights   06/19/21 1715 06/20/21 1305  Weight: 120.7 kg 119.3 kg    Examination: General exam: Appears calm and comfortable  Respiratory system: Clear to auscultation. Respiratory effort normal. Cardiovascular system: S1 & S2 heard, RRR. No pedal edema. Gastrointestinal system: Abdomen is nondistended, soft and nontender. Normal bowel sounds heard. Central nervous system: Alert and oriented. Non focal exam. Speech clear  Extremities: RLE in stabilizer  Skin: No rashes, lesions or ulcers on exposed skin  Psychiatry: Judgement and insight appear  stable. Mood & affect appropriate.    Data Reviewed: I have personally reviewed following labs and imaging studies  CBC: Recent Labs  Lab 06/20/21 1305 06/21/21 0329 06/22/21 0327 06/23/21 0319 06/24/21 0742  WBC 11.7* 15.3* 14.6* 12.7* 10.8*  HGB 11.1* 10.9* 10.8* 10.4* 10.3*  HCT 35.3* 34.0* 34.1* 32.0* 32.0*  MCV 92.4 90.4 90.5 90.9 90.9  PLT 279 277 286 260 [id number]    Basic Metabolic Panel: Recent Labs  Lab 06/20/21 1305 06/21/21 0329 06/22/21 0326 06/22/21 0327 06/23/21 0319 06/24/21 0742  NA 138 145  --  144 137 142  K 4.5 5.4*  --  4.3 4.2 4.2  CL 106 113*  --  110 104 112*  CO2 21* 22  --  '23 23 23  '$ GLUCOSE 299* 247*  --  131* 165* 157*  BUN 61* 58*  --  45* 57* 67*  CREATININE 3.39* 3.00* 2.68* 2.71* 2.94* 2.91*  CALCIUM 9.2 9.8  --  9.8 9.3 9.3  MG  --   --   --   --  2.3  --   PHOS  --   --   --   --  3.9  --     GFR: Estimated Creatinine Clearance: 31.4 mL/min (A) (by C-G formula based on SCr of 2.91 mg/dL (H)). Liver Function Tests: Recent Labs  Lab 06/23/21 0319  ALBUMIN 3.1*    No results for input(s): LIPASE, AMYLASE in the last 168 hours. No results for input(s): AMMONIA in the last 168 hours. Coagulation Profile: No results for input(s): INR, PROTIME in the last 168 hours. Cardiac Enzymes: No results for input(s): CKTOTAL, CKMB, CKMBINDEX, TROPONINI in the last 168 hours. BNP (last 3 results) No results for input(s): PROBNP in the last 8760 hours. HbA1C: No results for input(s): HGBA1C in the last 72 hours.  CBG: Recent Labs  Lab 06/23/21 0711 06/23/21 1150 06/23/21 1612 06/23/21 2119 06/24/21 0724  GLUCAP 140* 137* 74 162* 155*    Lipid Profile: No results for input(s): CHOL, HDL, LDLCALC, TRIG, CHOLHDL, LDLDIRECT in the last 72 hours. Thyroid Function Tests: Recent Labs    06/23/21 0319  TSH 0.824    Anemia Panel: Recent Labs    06/23/21 0319  VITAMINB12 1,135*  FOLATE 14.1  FERRITIN 298  TIBC 220*  IRON 30*   RETICCTPCT 3.0    Sepsis Labs: No results for input(s): PROCALCITON, LATICACIDVEN in the last 168 hours.  Recent Results (from the past 240 hour(s))  SARS Coronavirus 2 by RT PCR (hospital order, performed in [facility] hospital lab) Nasopharyngeal Nasopharyngeal Swab     Status: None   Collection Time: 06/20/21  1:01 PM   Specimen: Nasopharyngeal Swab  Result Value Ref Range Status   SARS Coronavirus 2 NEGATIVE NEGATIVE Final  Comment: (NOTE) SARS-CoV-2 target nucleic acids are NOT DETECTED.  The SARS-CoV-2 RNA is generally detectable in upper and lower respiratory specimens during the acute phase of infection. The lowest concentration of SARS-CoV-2 viral copies this assay can detect is 250 copies / mL. A negative result does not preclude SARS-CoV-2 infection and should not be used as the sole basis for treatment or other patient management decisions.  A negative result may occur with improper specimen collection / handling, submission of specimen other than nasopharyngeal swab, presence of viral mutation(s) within the areas targeted by this assay, and inadequate number of viral copies (<250 copies / mL). A negative result must be combined with clinical observations, patient history, and epidemiological information.  Fact Sheet for Patients:   StrictlyIdeas.no  Fact Sheet for Healthcare Providers: BankingDealers.co.za  This test is not yet approved or  cleared by the [place] FDA and has been authorized for detection and/or diagnosis of SARS-CoV-2 by FDA under an Emergency Use Authorization (EUA).  This EUA will remain in effect (meaning this test can be used) for the duration of the COVID-19 declaration under Section 564(b)(1) of the Act, 21 U.S.C. section 360bbb-3(b)(1), unless the authorization is terminated or revoked sooner.  Performed at [facility], [place] [address], [place], [place]  [address]        Radiology Studies: No results found.    Scheduled Meds:  allopurinol  100 mg Oral Daily   carvedilol  25 mg Oral BID WC   cloNIDine  0.2 mg Oral BID   docusate sodium  100 mg Oral BID   enoxaparin (LOVENOX) injection  30 mg Subcutaneous Q12H   ferrous sulfate  325 mg Oral Q breakfast   furosemide  40 mg Oral Daily   hydrALAZINE  100 mg Oral TID   insulin aspart  0-20 Units Subcutaneous TID WC   insulin aspart  0-5 Units Subcutaneous QHS   insulin aspart  6 Units Subcutaneous TID WC   insulin glargine-yfgn  40 Units Subcutaneous BID   mouth rinse  15 mL Mouth Rinse BID   multivitamin with minerals  1 tablet Oral Daily   rosuvastatin  10 mg Oral Daily   Continuous Infusions:  sodium chloride Stopped (06/21/21 1030)   methocarbamol (ROBAXIN) IV Stopped (06/20/21 1848)     LOS: 3 days      Time spent: 15 minutes   Dessa Phi, DO [facility] 06/24/2021, 11:09 AM   Available via Epic secure chat 7am-7pm After these hours, please refer to coverage provider listed on [email or web address]

## 2021-06-24 NOTE — Progress Notes (Signed)
Physical Therapy Treatment Patient Details Name: Derek Ibarra MRN: [id number] DOB: 1956/02/07 Today's Date: 06/24/2021   History of Present Illness Patient is s/p ORIF of right patellar fracture. PMH significant for DM, gout, HTN, Lt humeral frature repair (2021).    PT Comments    POD # 4 Pt in bed received pain just prior to session.  Reports 6/10 pain at rest.  Derek Ibarra to remove ICE then reapplied all 5 straps "snug".  Pt AxO x 3 and very knowledgeable and aware of his precautions.  General bed mobility comments: using belt pt able to self transfer from supine to EOB with KI on.  General transfer comment: from elevated bed, pt self able to rise with NWB R LE and use of walker.  Greater difficulty completing 1/4 pivot so instructed to "hop" a few steps.  Pt aware he is TTWB but prefers to self place NWB due to fear of increased pain. General Gait Details: Pt declined to attempt any forweard amb due to fear of increased pain.  "I just now got it settled".  Pt only agreed to transfer to recliner. Performed a few TE's of AP, knee presses, Hip ABD and SLR all with KI secured.  Applied ICE at end of session. Pt plans to return home "once my hospital bed arrives".  Pt lives in a [place] with a stair lift to second floor.  Making room downstairs for the hospital bed.   Recommendations for follow up therapy are one component of a multi-disciplinary discharge planning process, led by the attending physician.  Recommendations may be updated based on patient status, additional functional criteria and insurance authorization.  Follow Up Recommendations  Home health PT;Supervision for mobility/OOB     Equipment Recommendations  Rolling walker with 5" wheels    Recommendations for Other Services       Precautions / Restrictions Precautions Precautions: Fall Required Braces or Orthoses: Knee Immobilizer - Right Knee Immobilizer - Right: On at all times Restrictions Weight Bearing  Restrictions: Yes RLE Weight Bearing: Touchdown weight bearing Other Position/Activity Restrictions: NO knee ROM     Mobility  Bed Mobility Overal bed mobility: Needs Assistance Bed Mobility: Supine to Sit     Supine to sit: Supervision;HOB elevated     General bed mobility comments: using belt pt able to self transfer from supine to EOB with KI on    Transfers Overall transfer level: Needs assistance Equipment used: Rolling walker (2 wheeled) Transfers: Sit to/from [facility] Sit to Stand: Supervision Stand pivot transfers: Supervision;Min guard       General transfer comment: from elevated bed, pt self able to rise with NWB R LE and use of walker.  Greater difficulty completing 1/4 pivot so instructed to "hop" a few steps.  Pt aware he is TTWB but prefers to self place NWB due to fear of increased pain.  Ambulation/Gait             General Gait Details: Pt declined to attempt any forweard amb due to fear of increased pain.  "I just now got it settled".  Pt only agreed to transfer to recliner.   Stairs             Wheelchair Mobility    Modified Rankin (Stroke Patients Only)       Balance  Cognition Arousal/Alertness: Awake/alert Behavior During Therapy: WFL for tasks assessed/performed Overall Cognitive Status: Within Functional Limits for tasks assessed                                 General Comments: AxO x 3 very knowlegable and aware of current restrictions and KI use.  Motivated "to go home".      Exercises      General Comments        Pertinent Vitals/Pain Pain Assessment: 0-10 Faces Pain Scale: Hurts little more Pain Location: Rt knee Pain Descriptors / Indicators: Grimacing;Sore;Tender Pain Intervention(s): Monitored during session;Premedicated before session;Repositioned;Ice applied    Home Living                      Prior  Function            PT Goals (current goals can now be found in the care plan section) Progress towards PT goals: Progressing toward goals    Frequency           PT Plan Current plan remains appropriate    Co-evaluation              AM-PAC PT "6 Clicks" Mobility   Outcome Measure  Help needed turning from your back to your side while in a flat bed without using bedrails?: A Little Help needed moving from lying on your back to sitting on the side of a flat bed without using bedrails?: A Little Help needed moving to and from a bed to a chair (including a wheelchair)?: A Little Help needed standing up from a chair using your arms (e.g., wheelchair or bedside chair)?: A Little Help needed to walk in hospital room?: A Lot Help needed climbing 3-5 steps with a railing? : Total 6 Click Score: 15    End of Session Equipment Utilized During Treatment: Gait belt Activity Tolerance: Patient tolerated treatment well Patient left: in chair;with call bell/phone within reach;with chair alarm set Nurse Communication: Mobility status PT Visit Diagnosis: Pain;Muscle weakness (generalized) (M62.81);Difficulty in walking, not elsewhere classified (R26.2) Pain - Right/Left: Right Pain - part of body: Knee     Time: 1105-1130 PT Time Calculation (min) (ACUTE ONLY): 25 min  Charges:  $Therapeutic Exercise: 8-22 mins $Therapeutic Activity: 8-22 mins                     Derek Ibarra  PTA Acute  Rehabilitation Services Pager      [phone] Office      [phone]

## 2021-06-24 NOTE — Progress Notes (Signed)
Orthopedics Progress Note  Subjective: Pain controlled,therapy improving  Objective:  Vitals:   06/24/21 0054 06/24/21 0555  BP: 136/72 138/61  Pulse: (!) 102 94  Resp: 17 17  Temp: 97.6 F (36.4 C) 98.3 F (36.8 C)  SpO2: 97% 99%    General: Awake and alert  Musculoskeletal: Right knee with ice on it in brace Neurovascularly intact  Lab Results  Component Value Date   WBC 10.8 (H) 06/24/2021   HGB 10.3 (L) 06/24/2021   HCT 32.0 (L) 06/24/2021   MCV 90.9 06/24/2021   PLT 255 06/24/2021       Component Value Date/Time   NA 137 06/23/2021 0319   K 4.2 06/23/2021 0319   CL 104 06/23/2021 0319   CO2 23 06/23/2021 0319   GLUCOSE 165 (H) 06/23/2021 0319   BUN 57 (H) 06/23/2021 0319   CREATININE 2.94 (H) 06/23/2021 0319   CALCIUM 9.3 06/23/2021 0319   GFRNONAA 23 (L) 06/23/2021 0319   GFRAA 32 (L) 10/17/2019 0430    Lab Results  Component Value Date   INR 1.05 10/27/2014    Assessment/Plan: POD #4 s/p Procedure(s): OPEN REDUCTION INTERNAL (ORIF) FIXATION PATELLA Continue PT, OT, discharge planning - need home health PT and hospital bed on ground floor  [employer]. Veverly Fells, MD 06/24/2021 7:52 AM

## 2021-06-24 NOTE — TOC Progression Note (Signed)
Transition of Care [facility]) - Progression Note   Patient Details  Name: Derek Ibarra MRN: [id number] Date of Birth: 1956-08-12  Transition of Care [facility] [facility]) CM/SW [place], LCSW Phone Number: 06/24/2021, 10:38 AM  Clinical Narrative: CSW spoke with RN, Sondra Come, from the [facility]. RN requested clinicals and [place] orders be faxed to patient's PCP's office, Dr. Carilyn Goodpasture, at [phone]. Per RN, the DME will not be approved and set up within 24 hours. Clinicals and orders faxed to Dr. Drucilla Chalet office. CSW spoke with patient to provide an update and discuss referral to [facility] agencies in-network with the [place]. Patient is agreeable to referral to [facility]. CSW made referral to Amy with [facility]. [facility] to start insurance authorization for [facility] with the [place]. TOC to follow.  Expected Discharge Plan: [place] Barriers to Discharge: Continued Medical Work up, Ship broker  Expected Discharge Plan and Services Expected Discharge Plan: [place] In-house Referral: Clinical Social Work Post Acute Care Choice: Home Health, Durable Medical Equipment Living arrangements for the past 2 months: [place]: PT [place]: [place] Date [place]: 06/24/21 Time [place]: 1027 Representative spoke with at [place]: Amy  Readmission Risk Interventions No flowsheet data found.

## 2021-06-25 ENCOUNTER — Encounter (HOSPITAL_COMMUNITY): Payer: Self-pay | Admitting: Orthopedic Surgery

## 2021-06-25 LAB — GLUCOSE, CAPILLARY
Glucose-Capillary: 81 mg/dL (ref 70–99)
Glucose-Capillary: 128 mg/dL — ABNORMAL HIGH (ref 70–99)
Glucose-Capillary: 111 mg/dL — ABNORMAL HIGH (ref 70–99)
Glucose-Capillary: 107 mg/dL — ABNORMAL HIGH (ref 70–99)

## 2021-06-25 NOTE — Progress Notes (Signed)
Occupational Therapy Treatment Patient Details Name: Derek Ibarra MRN: [id number] DOB: 27-Jul-1956 Today's Date: 06/25/2021   History of present illness Patient is s/p ORIF of right patellar fracture. PMH significant for DM, gout, HTN, Lt humeral frature repair (2021).   OT comments  Patient reports has transferred to drop arm commode since it has been dropped off to his room and does not need to use at this time. Performed squat pivot to recliner chair at supervision level however does place more than TDWB in R LE despite cues. Patient set up for grooming/hygiene tasks in chair and needing overall min A to doff soiled shorts and don clean pair needing assist to get clothing over R foot/heel. OT educate however patient placing more weight in R LE to complete dressing task. Recommend continued acute OT services to improve weight bearing management during ADLs, may benefit from [facility] DME or attempting dressing at bed level in long sitting.    Recommendations for follow up therapy are one component of a multi-disciplinary discharge planning process, led by the attending physician.  Recommendations may be updated based on patient status, additional functional criteria and insurance authorization.    Follow Up Recommendations  Home health OT    Equipment Recommendations  Hospital bed;Other (comment) (bariatric drop arm commode)       Precautions / Restrictions Precautions Precautions: Fall Required Braces or Orthoses: Knee Immobilizer - Right Knee Immobilizer - Right: On at all times Restrictions Weight Bearing Restrictions: Yes RLE Weight Bearing: Touchdown weight bearing Other Position/Activity Restrictions: NO knee ROM       Mobility Bed Mobility Overal bed mobility: Needs Assistance Bed Mobility: Supine to Sit     Supine to sit: Supervision;HOB elevated     General bed mobility comments: using belt pt able to self transfer from supine to EOB with KI on    Transfers Overall  transfer level: Needs assistance Equipment used: None Transfers: Squat Pivot Transfers     Squat pivot transfers: Supervision     General transfer comment: please see toilet transfer in ADL section    Balance Overall balance assessment: Needs assistance Sitting-balance support: Feet supported Sitting balance-Leahy Scale: Normal     Standing balance support: During functional activity Standing balance-Leahy Scale: Poor Standing balance comment: reliant on UE support during transfer due to WB restrictions                           ADL either performed or assessed with clinical judgement   ADL Overall ADL's : Needs assistance/impaired     Grooming: Oral care;Wash/dry face;Wash/dry hands;Set up;Sitting       Lower Body Bathing: Supervison/ safety;Sit to/from stand Lower Body Bathing Details (indicate cue type and reason): to wash perianal area however unable to maintain WB restriction     Lower Body Dressing: Minimal assistance;Sit to/from stand;Sitting/lateral leans Lower Body Dressing Details (indicate cue type and reason): patient needing assist to doff shorts from R leg and initiate clean shorts on R leg, however does NOT maintain weight bearing restrictions when pulling up shorts over hips Toilet Transfer: Supervision/safety;Squat-pivot Toilet Transfer Details (indicate cue type and reason): to recliner, can utilize upper body strength to squat pivot to chair with minimal weight placed in R LE however is still more than TDWB, needing cues for technique                  Cognition Arousal/Alertness: Awake/alert Behavior During Therapy: [facility] for tasks assessed/performed Overall  Cognitive Status: Within Functional Limits for tasks assessed                                                     Pertinent Vitals/ Pain       Pain Assessment: Faces Faces Pain Scale: Hurts a little bit Pain Location: Rt knee Pain Descriptors / Indicators:  Sore Pain Intervention(s): Monitored during session         Frequency  Min 2X/week        Progress Toward Goals  OT Goals(current goals can now be found in the care plan section)  Progress towards OT goals: Progressing toward goals  Acute Rehab OT Goals Patient Stated Goal: to improve functional tasks in order to go home OT Goal Formulation: With patient Time For Goal Achievement: 07/05/21 Potential to Achieve Goals: Good ADL Goals Pt Will Perform Lower Body Dressing: with mod assist;with adaptive equipment;with supervision Pt Will Transfer to Toilet: with supervision;ambulating;grab bars;regular height toilet Pt Will Perform Toileting - Clothing Manipulation and hygiene: with supervision;sitting/lateral leans;sit to/from stand  Plan Discharge plan remains appropriate       AM-PAC OT "6 Clicks" Daily Activity     Outcome Measure   Help from another person eating meals?: None Help from another person taking care of personal grooming?: A Little Help from another person toileting, which includes using toliet, bedpan, or urinal?: A Lot Help from another person bathing (including washing, rinsing, drying)?: A Little Help from another person to put on and taking off regular upper body clothing?: A Little Help from another person to put on and taking off regular lower body clothing?: A Little 6 Click Score: 18    End of Session    OT Visit Diagnosis: Other abnormalities of gait and mobility (R26.89);Unsteadiness on feet (R26.81);Pain Pain - Right/Left: Right Pain - part of body: Knee   Activity Tolerance Patient tolerated treatment well   Patient Left in chair;with call bell/phone within reach;with chair alarm set   Nurse Communication Mobility status        Time: [id number] OT Time Calculation (min): 23 min  Charges: OT General Charges $OT Visit: 1 Visit OT Treatments $Self Care/Home Management : 23-37 mins  Delbert Phenix OT OT pager: [phone]   Rosemary Holms 06/25/2021, 11:53 AM

## 2021-06-25 NOTE — Progress Notes (Signed)
   Subjective: 5 Days Post-Op Procedure(s) (LRB): OPEN REDUCTION INTERNAL (ORIF) FIXATION PATELLA (Right)  Pt doing well, mild pain, ready for d/c home once home health arranged Denies any new symptoms or issues Otherwise doing fairly well Appreciate medical team aiding with hypertension Patient reports pain as mild.  Objective:   VITALS:   Vitals:   06/24/21 2058 06/25/21 0441  BP: 129/62 (!) 144/78  Pulse: (!) 106 75  Resp: 17 17  Temp: 98.6 F (37 C) 98.3 F (36.8 C)  SpO2: 100% 98%    Right knee incision healing well Dressing changed today to aquacel Nv intact distally No rashes or edema distally Knee immobilizer in place  LABS Recent Labs    06/23/21 0319 06/24/21 0742  HGB 10.4* 10.3*  HCT 32.0* 32.0*  WBC 12.7* 10.8*  PLT 260 255    Recent Labs    06/23/21 0319 06/24/21 0742  NA 137 142  K 4.2 4.2  BUN 57* 67*  CREATININE 2.94* 2.91*  GLUCOSE 165* 157*     Assessment/Plan: 5 Days Post-Op Procedure(s) (LRB): OPEN REDUCTION INTERNAL (ORIF) FIXATION PATELLA (Right) Pt may d/c today once home health arranged F/u in the office in 2 weeks Pain management as needed Pt in agreement    Brad Luna [place], [place] is now [employer] [facility] [address], [place], [place], [place] [address] Phone: [phone] [email or web address] Facebook  [employer]

## 2021-06-25 NOTE — TOC Progression Note (Addendum)
Transition of Care [facility]) - Progression Note   Patient Details  Name: Derek Ibarra MRN: [id number] Date of Birth: [birth year]  Transition of Care [facility]) CM/SW [place], LCSW Phone Number: 06/25/2021, 3:28 PM  Clinical Narrative: CSW left message with [facility] RN, Sondra Come [phone]), requesting follow up regarding authorization for DME and [facility] services. TOC awaiting VA approval.  Addendum: 3:38pm-CSW received call from [place]. Per RN, consults per placed for [facility] and DME but the patient has not yet been approved.  Expected Discharge Plan: [place] Barriers to Discharge: Continued Medical Work up, Ship broker  Expected Discharge Plan and Services Expected Discharge Plan: [place] In-house Referral: Clinical Social Work Post Acute Care Choice: Home Health, Durable Medical Equipment Living arrangements for the past 2 months: [place] Expected Discharge Date: 06/25/21               HH Arranged: PT [place]: [place] Date [place]: 06/24/21 Time [place]: 1027 Representative spoke with at [place]: Amy  Readmission Risk Interventions No flowsheet data found.

## 2021-06-26 LAB — GLUCOSE, CAPILLARY
Glucose-Capillary: 59 mg/dL — ABNORMAL LOW (ref 70–99)
Glucose-Capillary: 130 mg/dL — ABNORMAL HIGH (ref 70–99)
Glucose-Capillary: 133 mg/dL — ABNORMAL HIGH (ref 70–99)
Glucose-Capillary: 116 mg/dL — ABNORMAL HIGH (ref 70–99)
Glucose-Capillary: 96 mg/dL (ref 70–99)

## 2021-06-26 NOTE — Plan of Care (Signed)
Plan of care reviewed and discussed with the patient. 

## 2021-06-26 NOTE — Progress Notes (Addendum)
Inpatient Diabetes Program Recommendations  AACE/ADA: New Consensus Statement on Inpatient Glycemic Control (2015)  Target Ranges:  Prepandial:   less than 140 mg/dL      Peak postprandial:   less than 180 mg/dL (1-2 hours)      Critically ill patients:  140 - 180 mg/dL  Results for [facility], [place] (MRN [id number]) as of 06/26/2021 08:06  Ref. Range 06/25/2021 07:19 06/25/2021 11:33 06/25/2021 16:59 06/25/2021 21:55  Glucose-Capillary Latest Ref Range: 70 - 99 mg/dL 81  6 units Novolog  40 units Semglee  128 (H)  9 units Novolog  111 (H)  6 units Novolog  107 (H)     40 units Semglee  Results for [facility], [place] (MRN [id number]) as of 06/26/2021 08:06  Ref. Range 06/26/2021 07:16  Glucose-Capillary Latest Ref Range: 70 - 99 mg/dL 59 (L)    Admit with: Right patella fracture  History: DM  Home DM Meds: Lantus 40 units BID        Metformin 500 mg Daily  Current Orders: Semglee 40 units BID      Novolog Resistant Correction Scale/ SSI (0-20 units) TID AC + HS      Novolog 6 units TID with meals    MD- Note CBG down to 59 mg/dl this AM  Please consider reduction of Semglee to 30 units BID     --Will follow patient during hospitalization--  Wyn Quaker RN, MSN, CDE Diabetes Coordinator Inpatient Glycemic Control Team Team Pager: [phone] (8a-5p)

## 2021-06-26 NOTE — TOC Progression Note (Signed)
Transition of Care [facility]) - Progression Note   Patient Details  Name: Derek Ibarra MRN: [id number] Date of Birth: 01-25-1956  Transition of Care [facility]) CM/SW [place], LCSW Phone Number: 06/26/2021, 3:09 PM  Clinical Narrative: CSW notified by [facility] with [facility] that patient is agreeable to using his Medicare rather than VA benefits for [facility]. CSW followed up with the [facility] prosthetics department and confirmed the patient's hospital bed will be delivered tomorrow morning if someone is there to accept the delivery. CSW spoke with patient and patient confirmed the bed will be delivered between 9am-12pm tomorrow. Patient will also need a rolling walker and 3N1 and is agreeable to using his Medicare benefits. CSW made DME referral to [facility] with [employer]. DME will be drop shipped to patient's home tomorrow. TOC to follow.  Expected Discharge Plan: [place] Barriers to Discharge: Continued Medical Work up, Ship broker  Expected Discharge Plan and Services Expected Discharge Plan: [place] In-house Referral: Clinical Social Work Post Acute Care Choice: Home Health, Durable Medical Equipment Living arrangements for the past 2 months: [place] Expected Discharge Date: 06/26/21               HH Arranged: PT [place]: [place] Date [place]: 06/24/21 Time [place]: 1027 Representative spoke with at [place]: Amy  Readmission Risk Interventions No flowsheet data found.

## 2021-06-26 NOTE — Plan of Care (Signed)

## 2021-06-26 NOTE — Progress Notes (Signed)
Orthopedics Progress Note  Subjective: Pain well controlled, awaiting discharge  Objective:  Vitals:   06/25/21 2151 06/26/21 0501  BP: 130/80 122/90  Pulse: 82 94  Resp: 16 18  Temp: 98.3 F (36.8 C) 98 F (36.7 C)  SpO2: 100% 99%    General: Awake and alert  Musculoskeletal: right knee dressing CDI, Neg [place] Neurovascularly intact  Lab Results  Component Value Date   WBC 10.8 (H) 06/24/2021   HGB 10.3 (L) 06/24/2021   HCT 32.0 (L) 06/24/2021   MCV 90.9 06/24/2021   PLT 255 06/24/2021       Component Value Date/Time   NA 142 06/24/2021 0742   K 4.2 06/24/2021 0742   CL 112 (H) 06/24/2021 0742   CO2 23 06/24/2021 0742   GLUCOSE 157 (H) 06/24/2021 0742   BUN 67 (H) 06/24/2021 0742   CREATININE 2.91 (H) 06/24/2021 0742   CALCIUM 9.3 06/24/2021 0742   GFRNONAA 23 (L) 06/24/2021 0742   GFRAA 32 (L) 10/17/2019 0430    Lab Results  Component Value Date   INR 1.05 10/27/2014    Assessment/Plan: POD #6 s/p Procedure(s): OPEN REDUCTION INTERNAL (ORIF) FIXATION PATELLA Home once home health approved and hospital bed delivered Up with PT and OT Continue Lovenox  Doran Heater. Veverly Fells, MD 06/26/2021 8:00 AM

## 2021-06-26 NOTE — Progress Notes (Signed)
Hypoglycemic Event  CBG: 59  Treatment: 4 oz juice/soda  Symptoms: None  Follow-up CBG: Time:08:11 CBG Result:130  Comments/MD notified: Brad D.-PA notified, Pt seen by diabetes coordinator, no new orders at this time.   Avon Gully M Eeva Schlosser

## 2021-06-26 NOTE — Progress Notes (Signed)
PT Cancellation Note  Patient Details Name: Derek Ibarra MRN: [id number] DOB: 1956/10/05   Cancelled Treatment:     Pt politely declined and requested pain meds.  Pt plans to D/C tomorrow as his [place] is being delivered to home tomorrow.   Rica Koyanagi  PTA Acute  Rehabilitation Services Pager      [phone] Office      [phone]

## 2021-06-27 LAB — CREATININE, SERUM
Creatinine, Ser: 2.97 mg/dL — ABNORMAL HIGH (ref 0.61–1.24)
GFR, Estimated: 22 mL/min — ABNORMAL LOW (ref >60)

## 2021-06-27 LAB — GLUCOSE, CAPILLARY
Glucose-Capillary: 58 mg/dL — ABNORMAL LOW (ref 70–99)
Glucose-Capillary: 70 mg/dL (ref 70–99)
Glucose-Capillary: 145 mg/dL — ABNORMAL HIGH (ref 70–99)
Glucose-Capillary: 113 mg/dL — ABNORMAL HIGH (ref 70–99)
Glucose-Capillary: 173 mg/dL — ABNORMAL HIGH (ref 70–99)

## 2021-06-27 NOTE — Discharge Summary (Signed)
In most cases prophylactic antibiotics for Dental procdeures after total joint surgery are not necessary.  Exceptions are as follows:  1. History of prior total joint infection  2. Severely immunocompromised (Organ Transplant, cancer chemotherapy, Rheumatoid biologic meds such as [place])  3. Poorly controlled diabetes (A1C &gt; 8.0, blood glucose over 200)  If you have one of these conditions, contact your surgeon for an antibiotic prescription, prior to your dental procedure. Orthopedic Discharge Summary        Physician Discharge Summary  Patient ID: Derek Ibarra MRN: [id number] DOB/AGE: Nov 23, 1955 65 y.o.  Admit date: 06/20/2021 Discharge date: 06/27/2021   Procedures:  Procedure(s) (LRB): OPEN REDUCTION INTERNAL (ORIF) FIXATION PATELLA (Right)  Attending Physician:  Dr. Esmond Plants  Admission Diagnoses:   right patella fracture  Discharge Diagnoses:  right patella fracture   Past Medical History:  Diagnosis Date   Arthritis    Diabetes mellitus without complication ([place])    Dyspnea    G6PD deficiency    Gout    Hypertension    Hypertensive heart disease    Morbid obesity ([place])    OSA on CPAP    16 CM pressure   Sleep apnea     PCP: [facility] [place]   Discharged Condition: good  Hospital Course:  Patient underwent the above stated procedure on 06/20/2021. Patient tolerated the procedure well and brought to the recovery room in good condition and subsequently to the floor. Patient progress with physical therapy well. Hospitalist consulted due to hypertension and one episode of hypoglycemia. Pain was well managed. Pt with extended stay due to awaiting approval from the [place] for DME and home health   Disposition: Discharge disposition: 01-Home or Self Care      with follow up in 2 weeks    Follow-up Information     Netta Cedars, MD. Call in 2 week(s).   Specialty: Orthopedic Surgery Why: [phone] Contact information: [address] [place] [address] [phone]         [facility] Follow up.   Specialty: General Practice Why: Follow up with PCP for BP.  Follow up with Nephrology for kidney function. Contact information: [address] [place] [address] [phone]         [facility] Follow up.   Specialty: [place] Why: PT Contact information: [place] [phone]                 Dental Antibiotics:  In most cases prophylactic antibiotics for Dental procdeures after total joint surgery are not necessary.  Exceptions are as follows:  1. History of prior total joint infection  2. Severely immunocompromised (Organ Transplant, cancer chemotherapy, Rheumatoid biologic meds such as [place])  3. Poorly controlled diabetes (A1C &gt; 8.0, blood glucose over 200)  If you have one of these conditions, contact your surgeon for an antibiotic prescription, prior to your dental procedure.  Discharge Instructions     Call MD / Call 911   Complete by: As directed    If you experience chest pain or shortness of breath, CALL 911 and be transported to the hospital emergency room.  If you develope a fever above 101 F, pus (white drainage) or increased drainage or redness at the wound, or calf pain, call your surgeon's office.   Call MD / Call 911   Complete by: As directed    If you experience chest pain or shortness of  breath, CALL 911 and be transported to the hospital emergency room.  If you develope a fever above 101 F, pus (white drainage) or increased drainage or redness at the wound, or calf pain, call your surgeon's office.   Constipation Prevention   Complete by: As directed    Drink plenty of fluids.  Prune juice may be helpful.  You may use a stool softener, such as Colace (over the counter) 100 mg twice a day.  Use MiraLax (over the counter) for constipation as needed.   Constipation Prevention    Complete by: As directed    Drink plenty of fluids.  Prune juice may be helpful.  You may use a stool softener, such as Colace (over the counter) 100 mg twice a day.  Use MiraLax (over the counter) for constipation as needed.   Diet - low sodium heart healthy   Complete by: As directed    Diet - low sodium heart healthy   Complete by: As directed    Increase activity slowly as tolerated   Complete by: As directed    Increase activity slowly as tolerated   Complete by: As directed    Post-operative opioid taper instructions:   Complete by: As directed    POST-OPERATIVE OPIOID TAPER INSTRUCTIONS: It is important to wean off of your opioid medication as soon as possible. If you do not need pain medication after your surgery it is ok to stop day one. Opioids include: Codeine, Hydrocodone(Norco, Vicodin), Oxycodone(Percocet, oxycontin) and hydromorphone amongst others.  Long term and even short term use of opiods can cause: Increased pain response Dependence Constipation Depression Respiratory depression And more.  Withdrawal symptoms can include Flu like symptoms Nausea, vomiting And more Techniques to manage these symptoms Hydrate well Eat regular healthy meals Stay active Use relaxation techniques(deep breathing, meditating, yoga) Do Not substitute Alcohol to help with tapering If you have been on opioids for less than two weeks and do not have pain than it is ok to stop all together.  Plan to wean off of opioids This plan should start within one week post op of your joint replacement. Maintain the same interval or time between taking each dose and first decrease the dose.  Cut the total daily intake of opioids by one tablet each day Next start to increase the time between doses. The last dose that should be eliminated is the evening dose.      Post-operative opioid taper instructions:   Complete by: As directed    POST-OPERATIVE OPIOID TAPER INSTRUCTIONS: It is important  to wean off of your opioid medication as soon as possible. If you do not need pain medication after your surgery it is ok to stop day one. Opioids include: Codeine, Hydrocodone(Norco, Vicodin), Oxycodone(Percocet, oxycontin) and hydromorphone amongst others.  Long term and even short term use of opiods can cause: Increased pain response Dependence Constipation Depression Respiratory depression And more.  Withdrawal symptoms can include Flu like symptoms Nausea, vomiting And more Techniques to manage these symptoms Hydrate well Eat regular healthy meals Stay active Use relaxation techniques(deep breathing, meditating, yoga) Do Not substitute Alcohol to help with tapering If you have been on opioids for less than two weeks and do not have pain than it is ok to stop all together.  Plan to wean off of opioids This plan should start within one week post op of your joint replacement. Maintain the same interval or time between taking each dose and first decrease the dose.  Cut the  total daily intake of opioids by one tablet each day Next start to increase the time between doses. The last dose that should be eliminated is the evening dose.          Allergies as of 06/27/2021       Reactions   Penicillins Rash   Sulfa Antibiotics Rash        Medication List     STOP taking these medications    lisinopril 40 MG tablet Commonly known as: ZESTRIL   metFORMIN 500 MG 24 hr tablet Commonly known as: GLUCOPHAGE-XR       TAKE these medications    albuterol 108 (90 Base) MCG/ACT inhaler Commonly known as: VENTOLIN HFA Inhale into the lungs every 6 (six) hours as needed for wheezing or shortness of breath.   allopurinol 100 MG tablet Commonly known as: ZYLOPRIM Take 1 tablet (100 mg total) by mouth daily. What changed: how much to take   carvedilol 25 MG tablet Commonly known as: COREG Take 25 mg by mouth 2 (two) times daily with a meal.   cloNIDine 0.2 MG  tablet Commonly known as: CATAPRES Take 1 tablet (0.2 mg total) by mouth 2 (two) times daily. What changed:  medication strength how much to take   enoxaparin 40 MG/0.4ML injection Commonly known as: LOVENOX Inject 0.3 mLs (30 mg total) into the skin every 12 (twelve) hours. 30 days post op   ferrous sulfate 325 (65 FE) MG tablet Take 325 mg by mouth daily with breakfast.   furosemide 40 MG tablet Commonly known as: LASIX Take 1 tablet (40 mg total) by mouth daily.   hydrALAZINE 100 MG tablet Commonly known as: APRESOLINE Take 100 mg by mouth 3 (three) times daily.   insulin glargine 100 UNIT/ML injection Commonly known as: LANTUS Inject 40 Units into the skin 2 (two) times daily.   methocarbamol 500 MG tablet Commonly known as: Robaxin Take 1 tablet (500 mg total) by mouth every 6 (six) hours as needed for muscle spasms.   multivitamin with minerals Tabs tablet Take 1 tablet by mouth daily.   oxyCODONE-acetaminophen 5-325 MG tablet Commonly known as: PERCOCET/ROXICET Take 1 tablet by mouth every 4 (four) hours as needed for severe pain.   potassium chloride SA 20 MEQ tablet Commonly known as: KLOR-CON Take 20 mEq by mouth daily.   rosuvastatin 10 MG tablet Commonly known as: CRESTOR Take 10 mg by mouth daily.               Durable Medical Equipment  (From admission, onward)           Start     Ordered   06/20/21 2134  DME Walker rolling  Once       Question Answer Comment  Walker: With 5 Inch Wheels   Patient needs a walker to treat with the following condition Right patella fracture      06/20/21 2133   06/20/21 2134  DME 3 n 1  Once        06/20/21 2133              Signed: Ventura Bruns 06/27/2021, 8:03 AM  [place] is now [facility] [address] [place], [place], [place] [address] Phone: [phone] Facebook  [employer]

## 2021-06-27 NOTE — Progress Notes (Signed)
   Subjective: 7 Days Post-Op Procedure(s) (LRB): OPEN REDUCTION INTERNAL (ORIF) FIXATION PATELLA (Right)  Pt feeling much better and ready for d/c Home health setup and hospital bed to be delivered this morning Denies any new symptoms or issues Patient reports pain as mild.  Objective:   VITALS:   Vitals:   06/26/21 2045 06/27/21 0527  BP: 137/64 122/66  Pulse: 75 80  Resp: 17 16  Temp: 97.6 F (36.4 C) 97.7 F (36.5 C)  SpO2: 97% 98%    Right knee incision healing well Nv intact distally No rashes or edema distally Knee immobilizer in place  LABS No results for input(s): HGB, HCT, WBC, PLT in the last 72 hours.  Recent Labs    06/27/21 0311  CREATININE 2.97*     Assessment/Plan: 7 Days Post-Op Procedure(s) (LRB): OPEN REDUCTION INTERNAL (ORIF) FIXATION PATELLA (Right) D/c home today now that home health and DME are available F/u in 2 weeks in the office Knee immobilizer and strict knee extension at all times Pain management as needed Call me for any issues with d/c   Kathrynn Speed, MPAS [place] is now [facility]  [place] [address] [place], [place], [place] [address] Phone: [phone] [email or web address] Facebook  [employer]

## 2021-06-27 NOTE — TOC Transition Note (Signed)
Transition of Care [facility]) - CM/SW Discharge Note  Patient Details  Name: Derek Ibarra MRN: [id number] Date of Birth: [birth year]  Transition of Care [facility]) CM/SW Contact:  Sherie Don, LCSW Phone Number: 06/27/2021, 2:16 PM  Clinical Narrative: CSW notified by Freda Munro with [employer] that patient's Medicare part B has not been activated. Although the DME/HH order was faxed earlier this week, only the hospital bed will be delivered today. Patient is agreeable to private paying for the DME out of pocket and seeing if the [place] will reimburse the patient for the rolling walker and 3N1.  DME orders and invoices from [place] faxed to the [facility] clinic [phone]).  [employer] delivered DME to patient's room. Son picked up DME to take home. Patient will discharge home via [place]. Medical necessity done; PTAR scheduled. TOC signing off.  Final next level of care: [place] Barriers to Discharge: Barriers Resolved  Patient Goals and CMS Choice Patient states their goals for this hospitalization and ongoing recovery are:: Return home with [facility] and hospital bed CMS Medicare.gov Compare Post Acute Care list provided to:: Patient Choice offered to / list presented to : Patient  Discharge Plan and Services In-house Referral: Clinical Social Work Post Acute Care Choice: Home Health, Durable Medical Equipment          DME Arranged: Gilford Rile rolling, 3-N-1 DME Agency: [employer] Date DME Agency Contacted: 06/26/21 Time DME Agency Contacted: [phone] Representative spoke with at DME Agency: Freda Munro HH Arranged: PT [place]: [place] Date Friend: 06/24/21 Time [place]: 1027 Representative spoke with at [place]: Amy  Readmission Risk Interventions No flowsheet data found.

## 2021-06-27 NOTE — Progress Notes (Signed)
Significant other cannot get the pt out of the car by herself, son will not be able to get him today as he is working. Pt is requesting for EMS transport. Case manager was consulted and requested to [place].

## 2021-06-27 NOTE — Plan of Care (Signed)
Plan of care reviewed and discussed with the patient. 

## 2021-06-27 NOTE — Progress Notes (Signed)
Pt has DC order.equipments were taken by son to home: [facility] and walker, hospital bed was also delivered to home. AVS was discussed to pt and answered all his question. 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 Pt was encouraged to increase fluid and take OTC med for severe constipation, pt is passing gas. Significant other will get the pt.

## 2021-06-28 NOTE — Progress Notes (Signed)
Pt discharged with PTAR with all his belongings.

## 2021-06-28 NOTE — Plan of Care (Signed)
  Problem: Education: Goal: Knowledge of General Education information will improve Description: Including pain rating scale, medication(s)/side effects and non-pharmacologic comfort measures Outcome: Adequate for Discharge   Problem: Clinical Measurements: Goal: Ability to maintain clinical measurements within normal limits will improve Outcome: Adequate for Discharge Goal: Will remain free from infection Outcome: Adequate for Discharge Goal: Diagnostic test results will improve Outcome: Adequate for Discharge Goal: Respiratory complications will improve Outcome: Adequate for Discharge Goal: Cardiovascular complication will be avoided Outcome: Adequate for Discharge   Problem: Activity: Goal: Risk for activity intolerance will decrease Outcome: Adequate for Discharge   Problem: Coping: Goal: Level of anxiety will decrease Outcome: Adequate for Discharge   Problem: Elimination: Goal: Will not experience complications related to bowel motility Outcome: Adequate for Discharge   Problem: Pain Managment: Goal: General experience of comfort will improve Outcome: Adequate for Discharge   Problem: Safety: Goal: Ability to remain free from injury will improve Outcome: Adequate for Discharge   Problem: Skin Integrity: Goal: Risk for impaired skin integrity will decrease Outcome: Adequate for Discharge   Problem: Acute Rehab OT Goals (only OT should resolve) Goal: Pt. Will Perform Lower Body Dressing Outcome: Adequate for Discharge Goal: Pt. Will Transfer To Toilet Outcome: Adequate for Discharge Goal: Pt. Will Perform Toileting-Clothing Manipulation Outcome: Adequate for Discharge   Problem: Acute Rehab PT Goals(only PT should resolve) Goal: Pt Will Go Supine/Side To Sit Outcome: Adequate for Discharge Goal: Patient Will Transfer Sit To/From Stand Outcome: Adequate for Discharge Goal: Pt Will Ambulate Outcome: Adequate for Discharge

## 2021-06-28 NOTE — Progress Notes (Signed)
Pt discharged at 23:15 on 06/27/2021. Paper work completed at 0054 on 06/28/2021.

## 2022-08-12 DEATH — deceased

## 2022-11-18 IMAGING — DX DG KNEE 1-2V*R*
1 series · 1 of 1 positions shown · non-contrast
Comparison: None.

CLINICAL DATA: Intraoperative evaluation, incorrect needle count

EXAM:
RIGHT KNEE - 1-2 VIEW

[knee ap]
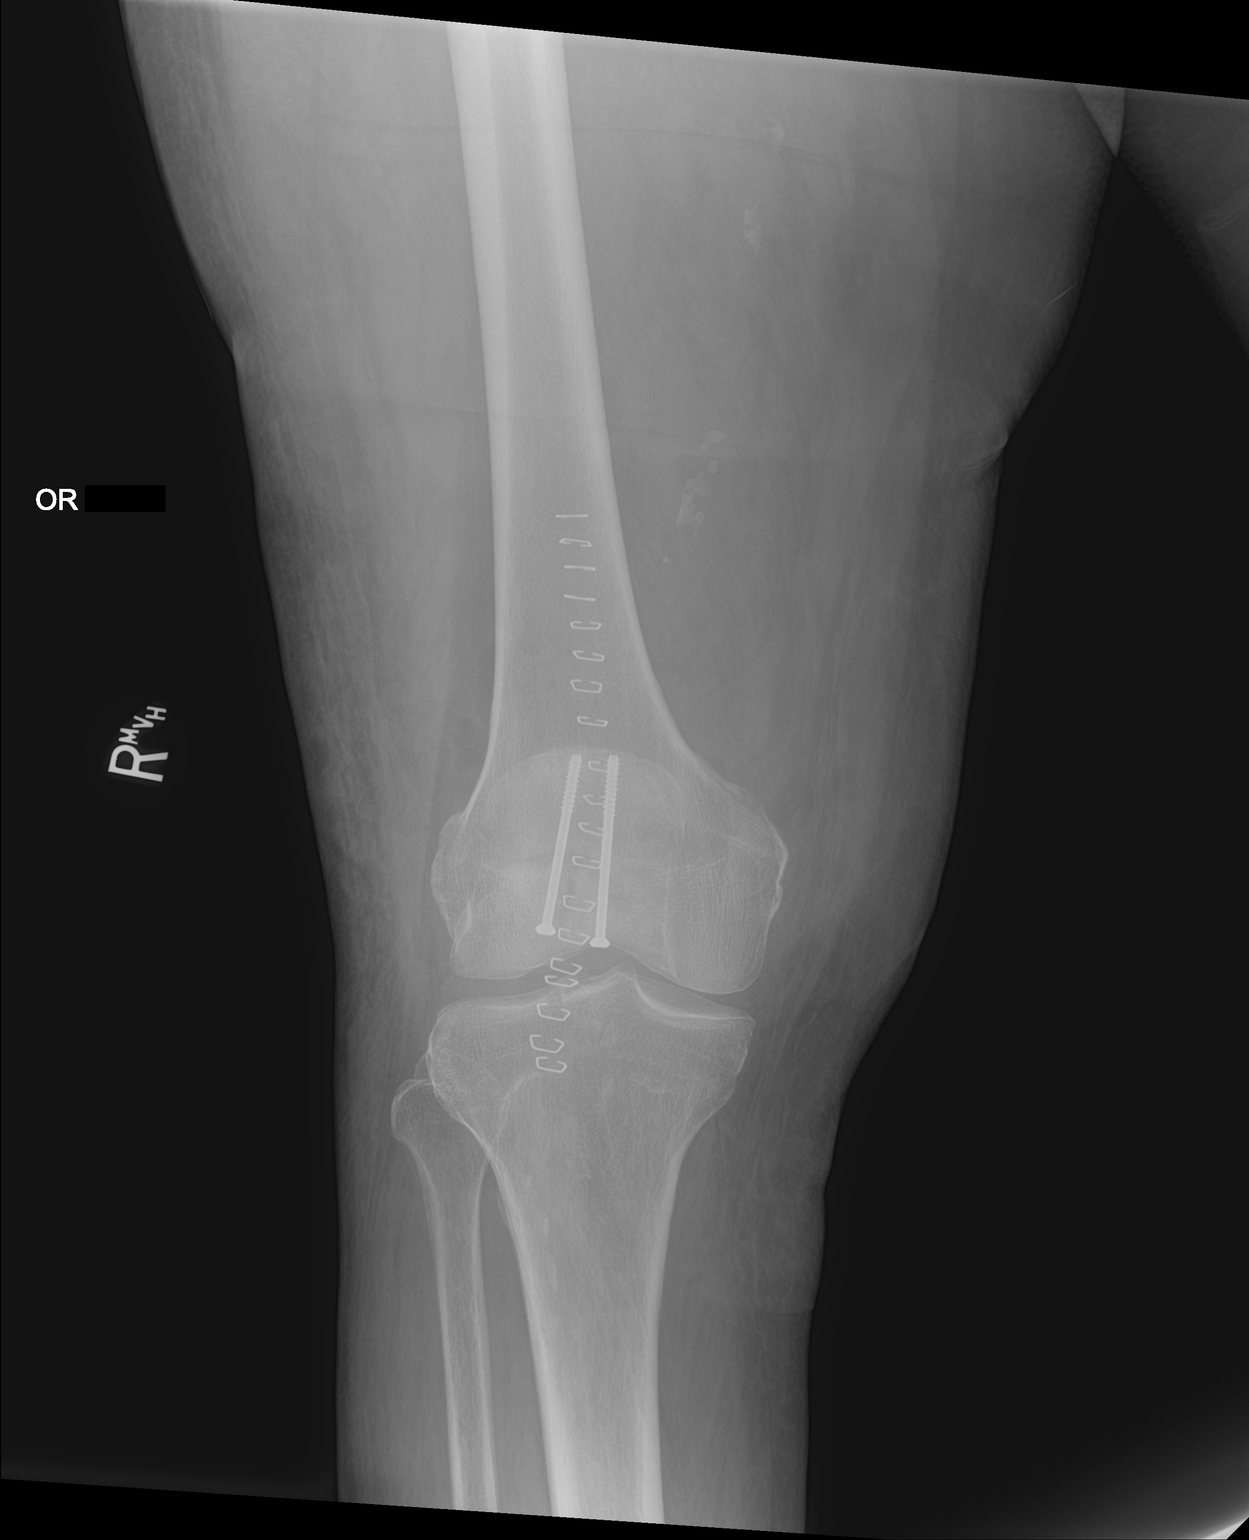

[1 of 1 positions shown; findings below may reference images not displayed]

FINDINGS: Frontal view of the right knee was performed. Examination covers the
area from the mid right femoral diaphysis through the proximal right
tibia and fibula. There are 2 screws within the patella. Patellar
fracture. Well aligned. Skin staples are identified. There are no
other radiopaque foreign bodies. Minimal vascular calcifications.
IMPRESSION: 1. Postsurgical changes related to patellar fracture repair.
2. No unexpected radiopaque foreign body. Specifically, no evidence
of surgical needle.

These results were called by telephone at the time of interpretation
on 06/20/2021 at [DATE] to the operating room and results given to
[place], who verbally acknowledged these results.
